# Patient Record
Sex: Male | Born: 1945 | Race: Black or African American | Hispanic: No | Marital: Single
Health system: Southern US, Community
[De-identification: ages and names within clinical notes are randomized; demographics above are authoritative.]

---

## 2013-01-02 ENCOUNTER — Inpatient Hospital Stay: Payer: Self-pay | Admitting: Internal Medicine

## 2013-01-02 LAB — COMPREHENSIVE METABOLIC PANEL
Albumin: 3.6 g/dL (ref 3.4–5.0)
Bilirubin,Total: 0.4 mg/dL (ref 0.2–1.0)
Calcium, Total: 8.3 mg/dL — ABNORMAL LOW (ref 8.5–10.1)
Chloride: 107 mmol/L (ref 98–107)
Co2: 25 mmol/L (ref 21–32)
Creatinine: 1.19 mg/dL (ref 0.60–1.30)
EGFR (African American): >60
EGFR (Non-African Amer.): >60
Glucose: 131 mg/dL — ABNORMAL HIGH (ref 65–99)
Osmolality: 286 (ref 275–301)
Potassium: 3.6 mmol/L (ref 3.5–5.1)
SGOT(AST): 64 U/L — ABNORMAL HIGH (ref 15–37)
SGPT (ALT): 56 U/L (ref 12–78)
Total Protein: 7.3 g/dL (ref 6.4–8.2)

## 2013-01-02 LAB — DIFFERENTIAL
Basophil: 1 %
Comment - H1-Com1: NORMAL
Eosinophil: 2 %
Lymphocytes: 10 %
Monocytes: 8 %
Segmented Neutrophils: 77 %
Variant Lymphocyte - H1-Rlymph: 2 %

## 2013-01-02 LAB — CBC
HGB: 13.4 g/dL (ref 13.0–18.0)
MCV: 98 fL (ref 80–100)
Platelet: 219 10*3/uL (ref 150–440)
RBC: 4.11 10*6/uL — ABNORMAL LOW (ref 4.40–5.90)
WBC: 14.2 10*3/uL — ABNORMAL HIGH (ref 3.8–10.6)

## 2013-01-02 LAB — PRO B NATRIURETIC PEPTIDE: B-Type Natriuretic Peptide: 2117 pg/mL — ABNORMAL HIGH (ref 0–125)

## 2013-01-02 LAB — CK TOTAL AND CKMB (NOT AT ARMC): CK, Total: 114 U/L (ref 35–232)

## 2013-01-02 LAB — TROPONIN I: Troponin-I: 0.04 ng/mL

## 2013-12-31 ENCOUNTER — Inpatient Hospital Stay: Payer: Self-pay | Admitting: Internal Medicine

## 2013-12-31 LAB — COMPREHENSIVE METABOLIC PANEL
ALBUMIN: 3.5 g/dL (ref 3.4–5.0)
ALT: 44 U/L (ref 12–78)
Alkaline Phosphatase: 64 U/L
Anion Gap: 2 — ABNORMAL LOW (ref 7–16)
BUN: 25 mg/dL — ABNORMAL HIGH (ref 7–18)
Bilirubin,Total: 1.3 mg/dL — ABNORMAL HIGH (ref 0.2–1.0)
CALCIUM: 8.2 mg/dL — AB (ref 8.5–10.1)
Chloride: 108 mmol/L — ABNORMAL HIGH (ref 98–107)
Co2: 27 mmol/L (ref 21–32)
Creatinine: 1.57 mg/dL — ABNORMAL HIGH (ref 0.60–1.30)
EGFR (African American): 53 — ABNORMAL LOW
EGFR (Non-African Amer.): 46 — ABNORMAL LOW
Glucose: 80 mg/dL (ref 65–99)
Osmolality: 277 (ref 275–301)
Potassium: 4.5 mmol/L (ref 3.5–5.1)
SGOT(AST): 50 U/L — ABNORMAL HIGH (ref 15–37)
Sodium: 137 mmol/L (ref 136–145)
Total Protein: 6.9 g/dL (ref 6.4–8.2)

## 2013-12-31 LAB — CBC
HCT: 37.9 % — AB (ref 40.0–52.0)
HGB: 12.6 g/dL — AB (ref 13.0–18.0)
MCH: 32.3 pg (ref 26.0–34.0)
MCHC: 33.2 g/dL (ref 32.0–36.0)
MCV: 98 fL (ref 80–100)
Platelet: 240 10*3/uL (ref 150–440)
RBC: 3.88 10*6/uL — ABNORMAL LOW (ref 4.40–5.90)
RDW: 15.5 % — AB (ref 11.5–14.5)
WBC: 7.3 10*3/uL (ref 3.8–10.6)

## 2013-12-31 LAB — TROPONIN I
TROPONIN-I: 0.02 ng/mL
Troponin-I: <0.02 ng/mL

## 2013-12-31 LAB — CK TOTAL AND CKMB (NOT AT ARMC)
CK, TOTAL: 188 U/L
CK, TOTAL: 161 U/L
CK, Total: 168 U/L
CK-MB: 4.5 ng/mL — ABNORMAL HIGH (ref 0.5–3.6)
CK-MB: 3.8 ng/mL — ABNORMAL HIGH (ref 0.5–3.6)
CK-MB: 3.8 ng/mL — AB (ref 0.5–3.6)

## 2013-12-31 LAB — PRO B NATRIURETIC PEPTIDE: B-Type Natriuretic Peptide: 9756 pg/mL — ABNORMAL HIGH (ref 0–125)

## 2014-01-01 LAB — CBC WITH DIFFERENTIAL/PLATELET
BASOS PCT: 0.9 %
Basophil #: 0.1 10*3/uL (ref 0.0–0.1)
Eosinophil #: 0.2 10*3/uL (ref 0.0–0.7)
Eosinophil %: 3.2 %
HCT: 38.2 % — ABNORMAL LOW (ref 40.0–52.0)
HGB: 12.3 g/dL — ABNORMAL LOW (ref 13.0–18.0)
LYMPHS ABS: 1.3 10*3/uL (ref 1.0–3.6)
Lymphocyte %: 21.9 %
MCH: 31.6 pg (ref 26.0–34.0)
MCHC: 32.3 g/dL (ref 32.0–36.0)
MCV: 98 fL (ref 80–100)
Monocyte #: 0.5 x10 3/mm (ref 0.2–1.0)
Monocyte %: 7.8 %
NEUTROS ABS: 3.9 10*3/uL (ref 1.4–6.5)
Neutrophil %: 66.2 %
PLATELETS: 238 10*3/uL (ref 150–440)
RBC: 3.91 10*6/uL — AB (ref 4.40–5.90)
RDW: 15.4 % — ABNORMAL HIGH (ref 11.5–14.5)
WBC: 5.9 10*3/uL (ref 3.8–10.6)

## 2014-01-01 LAB — BASIC METABOLIC PANEL
Anion Gap: 7 (ref 7–16)
BUN: 29 mg/dL — AB (ref 7–18)
CREATININE: 1.57 mg/dL — AB (ref 0.60–1.30)
Calcium, Total: 8.2 mg/dL — ABNORMAL LOW (ref 8.5–10.1)
Chloride: 106 mmol/L (ref 98–107)
Co2: 25 mmol/L (ref 21–32)
EGFR (African American): 53 — ABNORMAL LOW
EGFR (Non-African Amer.): 46 — ABNORMAL LOW
Glucose: 95 mg/dL (ref 65–99)
Osmolality: 281 (ref 275–301)
POTASSIUM: 4.1 mmol/L (ref 3.5–5.1)
Sodium: 138 mmol/L (ref 136–145)

## 2014-01-02 LAB — BASIC METABOLIC PANEL
Anion Gap: 5 — ABNORMAL LOW (ref 7–16)
BUN: 34 mg/dL — ABNORMAL HIGH (ref 7–18)
CALCIUM: 8.4 mg/dL — AB (ref 8.5–10.1)
CREATININE: 1.88 mg/dL — AB (ref 0.60–1.30)
Chloride: 104 mmol/L (ref 98–107)
Co2: 29 mmol/L (ref 21–32)
EGFR (African American): 42 — ABNORMAL LOW
EGFR (Non-African Amer.): 37 — ABNORMAL LOW
Glucose: 101 mg/dL — ABNORMAL HIGH (ref 65–99)
Osmolality: 283 (ref 275–301)
POTASSIUM: 4 mmol/L (ref 3.5–5.1)
SODIUM: 138 mmol/L (ref 136–145)

## 2014-05-24 ENCOUNTER — Emergency Department: Payer: Self-pay | Admitting: Emergency Medicine

## 2014-05-24 LAB — COMPREHENSIVE METABOLIC PANEL
ALT: 93 U/L — AB
ANION GAP: 11 (ref 7–16)
Albumin: 3 g/dL — ABNORMAL LOW (ref 3.4–5.0)
Alkaline Phosphatase: 76 U/L
BUN: 32 mg/dL — AB (ref 7–18)
Bilirubin,Total: 2.2 mg/dL — ABNORMAL HIGH (ref 0.2–1.0)
CHLORIDE: 104 mmol/L (ref 98–107)
Calcium, Total: 8.4 mg/dL — ABNORMAL LOW (ref 8.5–10.1)
Co2: 21 mmol/L (ref 21–32)
Creatinine: 1.72 mg/dL — ABNORMAL HIGH (ref 0.60–1.30)
GFR CALC AF AMER: 47 — AB
GFR CALC NON AF AMER: 41 — AB
GLUCOSE: 103 mg/dL — AB (ref 65–99)
OSMOLALITY: 279 (ref 275–301)
POTASSIUM: 4.5 mmol/L (ref 3.5–5.1)
SGOT(AST): 58 U/L — ABNORMAL HIGH (ref 15–37)
Sodium: 136 mmol/L (ref 136–145)
Total Protein: 6.8 g/dL (ref 6.4–8.2)

## 2014-05-24 LAB — CBC
HCT: 40.5 % (ref 40.0–52.0)
HGB: 12.7 g/dL — ABNORMAL LOW (ref 13.0–18.0)
MCH: 31.4 pg (ref 26.0–34.0)
MCHC: 31.3 g/dL — ABNORMAL LOW (ref 32.0–36.0)
MCV: 100 fL (ref 80–100)
PLATELETS: 250 10*3/uL (ref 150–440)
RBC: 4.04 10*6/uL — ABNORMAL LOW (ref 4.40–5.90)
RDW: 16.8 % — ABNORMAL HIGH (ref 11.5–14.5)
WBC: 9.2 10*3/uL (ref 3.8–10.6)

## 2014-05-24 LAB — SALICYLATE LEVEL

## 2014-05-24 LAB — ETHANOL
Ethanol %: <0.003 % (ref 0.000–0.080)
Ethanol: <3 mg/dL

## 2014-05-24 LAB — ACETAMINOPHEN LEVEL: Acetaminophen: <2 ug/mL

## 2014-05-25 LAB — DRUG SCREEN, URINE

## 2014-05-25 LAB — URINALYSIS, COMPLETE
BILIRUBIN, UR: NEGATIVE
BLOOD: NEGATIVE
GLUCOSE, UR: NEGATIVE mg/dL (ref 0–75)
Hyaline Cast: 72
Ketone: NEGATIVE
Leukocyte Esterase: NEGATIVE
NITRITE: NEGATIVE
PH: 5 (ref 4.5–8.0)
Protein: 30
Specific Gravity: 1.017 (ref 1.003–1.030)

## 2014-05-27 ENCOUNTER — Ambulatory Visit: Payer: Self-pay | Admitting: Oncology

## 2014-05-29 ENCOUNTER — Inpatient Hospital Stay: Payer: Self-pay | Admitting: Internal Medicine

## 2014-05-29 LAB — CBC
HCT: 38.7 % — ABNORMAL LOW (ref 40.0–52.0)
HGB: 12.1 g/dL — AB (ref 13.0–18.0)
MCH: 31.4 pg (ref 26.0–34.0)
MCHC: 31.3 g/dL — AB (ref 32.0–36.0)
MCV: 100 fL (ref 80–100)
PLATELETS: 227 10*3/uL (ref 150–440)
RBC: 3.86 10*6/uL — ABNORMAL LOW (ref 4.40–5.90)
RDW: 16.7 % — ABNORMAL HIGH (ref 11.5–14.5)
WBC: 8.1 10*3/uL (ref 3.8–10.6)

## 2014-05-29 LAB — TROPONIN I: TROPONIN-I: 0.09 ng/mL — AB

## 2014-05-29 LAB — BASIC METABOLIC PANEL
ANION GAP: 6 — AB (ref 7–16)
BUN: 30 mg/dL — AB (ref 7–18)
CALCIUM: 8 mg/dL — AB (ref 8.5–10.1)
CHLORIDE: 101 mmol/L (ref 98–107)
CO2: 28 mmol/L (ref 21–32)
Creatinine: 1.76 mg/dL — ABNORMAL HIGH (ref 0.60–1.30)
EGFR (Non-African Amer.): 40 — ABNORMAL LOW
GFR CALC AF AMER: 46 — AB
GLUCOSE: 96 mg/dL (ref 65–99)
OSMOLALITY: 276 (ref 275–301)
Potassium: 5.3 mmol/L — ABNORMAL HIGH (ref 3.5–5.1)
SODIUM: 135 mmol/L — AB (ref 136–145)

## 2014-05-29 LAB — PRO B NATRIURETIC PEPTIDE: B-TYPE NATIURETIC PEPTID: 7744 pg/mL — AB (ref 0–125)

## 2014-05-29 LAB — PROTIME-INR
INR: 1.3
PROTHROMBIN TIME: 16.2 s — AB (ref 11.5–14.7)

## 2014-05-29 LAB — APTT: ACTIVATED PTT: 30.4 s (ref 23.6–35.9)

## 2014-05-30 LAB — BASIC METABOLIC PANEL
Anion Gap: 9 (ref 7–16)
BUN: 32 mg/dL — ABNORMAL HIGH (ref 7–18)
CALCIUM: 8.6 mg/dL (ref 8.5–10.1)
Chloride: 101 mmol/L (ref 98–107)
Co2: 25 mmol/L (ref 21–32)
Creatinine: 1.81 mg/dL — ABNORMAL HIGH (ref 0.60–1.30)
EGFR (African American): 44 — ABNORMAL LOW
EGFR (Non-African Amer.): 38 — ABNORMAL LOW
Glucose: 105 mg/dL — ABNORMAL HIGH (ref 65–99)
Osmolality: 277 (ref 275–301)
Potassium: 4.9 mmol/L (ref 3.5–5.1)
Sodium: 135 mmol/L — ABNORMAL LOW (ref 136–145)

## 2014-05-30 LAB — CK-MB
CK-MB: 4.8 ng/mL — ABNORMAL HIGH (ref 0.5–3.6)
CK-MB: 5.2 ng/mL — AB (ref 0.5–3.6)
CK-MB: 5.7 ng/mL — ABNORMAL HIGH (ref 0.5–3.6)

## 2014-05-30 LAB — LIPID PANEL
Cholesterol: 104 mg/dL (ref 0–200)
HDL Cholesterol: 18 mg/dL — ABNORMAL LOW (ref 40–60)
Ldl Cholesterol, Calc: 72 mg/dL (ref 0–100)
Triglycerides: 68 mg/dL (ref 0–200)
VLDL Cholesterol, Calc: 14 mg/dL (ref 5–40)

## 2014-05-30 LAB — TROPONIN I
Troponin-I: 0.07 ng/mL — ABNORMAL HIGH
Troponin-I: 0.07 ng/mL — ABNORMAL HIGH

## 2014-05-30 LAB — HEPARIN LEVEL (UNFRACTIONATED): ANTI-XA(UNFRACTIONATED): 0.51 [IU]/mL (ref 0.30–0.70)

## 2014-05-31 LAB — BASIC METABOLIC PANEL
Anion Gap: 9 (ref 7–16)
BUN: 30 mg/dL — ABNORMAL HIGH (ref 7–18)
CO2: 25 mmol/L (ref 21–32)
Calcium, Total: 8.2 mg/dL — ABNORMAL LOW (ref 8.5–10.1)
Chloride: 102 mmol/L (ref 98–107)
Creatinine: 1.66 mg/dL — ABNORMAL HIGH (ref 0.60–1.30)
EGFR (African American): 49 — ABNORMAL LOW
EGFR (Non-African Amer.): 43 — ABNORMAL LOW
Glucose: 89 mg/dL (ref 65–99)
OSMOLALITY: 278 (ref 275–301)
POTASSIUM: 4.5 mmol/L (ref 3.5–5.1)
Sodium: 136 mmol/L (ref 136–145)

## 2014-06-01 LAB — BASIC METABOLIC PANEL
Anion Gap: 8 (ref 7–16)
BUN: 24 mg/dL — ABNORMAL HIGH (ref 7–18)
CHLORIDE: 99 mmol/L (ref 98–107)
CREATININE: 1.56 mg/dL — AB (ref 0.60–1.30)
Calcium, Total: 8 mg/dL — ABNORMAL LOW (ref 8.5–10.1)
Co2: 27 mmol/L (ref 21–32)
EGFR (African American): 53 — ABNORMAL LOW
EGFR (Non-African Amer.): 46 — ABNORMAL LOW
GLUCOSE: 97 mg/dL (ref 65–99)
Osmolality: 272 (ref 275–301)
Potassium: 4.6 mmol/L (ref 3.5–5.1)
Sodium: 134 mmol/L — ABNORMAL LOW (ref 136–145)

## 2014-06-18 LAB — COMPREHENSIVE METABOLIC PANEL
ALK PHOS: 128 U/L — AB
ALT: 35 U/L
AST: 41 U/L — AB (ref 15–37)
Albumin: 2.8 g/dL — ABNORMAL LOW (ref 3.4–5.0)
Anion Gap: 9 (ref 7–16)
BILIRUBIN TOTAL: 0.9 mg/dL (ref 0.2–1.0)
BUN: 25 mg/dL — ABNORMAL HIGH (ref 7–18)
CREATININE: 1.41 mg/dL — AB (ref 0.60–1.30)
Calcium, Total: 8.1 mg/dL — ABNORMAL LOW (ref 8.5–10.1)
Chloride: 104 mmol/L (ref 98–107)
Co2: 23 mmol/L (ref 21–32)
EGFR (African American): >60
GFR CALC NON AF AMER: 52 — AB
GLUCOSE: 101 mg/dL — AB (ref 65–99)
Osmolality: 276 (ref 275–301)
Potassium: 4.5 mmol/L (ref 3.5–5.1)
Sodium: 136 mmol/L (ref 136–145)
TOTAL PROTEIN: 6.8 g/dL (ref 6.4–8.2)

## 2014-06-18 LAB — CK TOTAL AND CKMB (NOT AT ARMC)
CK, Total: 226 U/L
CK, Total: 197 U/L
CK-MB: 5.1 ng/mL — ABNORMAL HIGH (ref 0.5–3.6)
CK-MB: 5.1 ng/mL — ABNORMAL HIGH (ref 0.5–3.6)

## 2014-06-18 LAB — CBC
HCT: 39.3 % — ABNORMAL LOW (ref 40.0–52.0)
HGB: 12.1 g/dL — ABNORMAL LOW (ref 13.0–18.0)
MCH: 29.8 pg (ref 26.0–34.0)
MCHC: 30.8 g/dL — AB (ref 32.0–36.0)
MCV: 97 fL (ref 80–100)
Platelet: 254 10*3/uL (ref 150–440)
RBC: 4.05 10*6/uL — ABNORMAL LOW (ref 4.40–5.90)
RDW: 15.7 % — ABNORMAL HIGH (ref 11.5–14.5)
WBC: 7.2 10*3/uL (ref 3.8–10.6)

## 2014-06-18 LAB — AMMONIA: Ammonia, Plasma: 20 mcmol/L (ref 11–32)

## 2014-06-18 LAB — DIGOXIN LEVEL: DIGOXIN: 0.4 ng/mL

## 2014-06-18 LAB — HEMOGLOBIN: HGB: 13.2 g/dL (ref 13.0–18.0)

## 2014-06-18 LAB — PROTIME-INR
INR: 1.5
PROTHROMBIN TIME: 17.8 s — AB (ref 11.5–14.7)

## 2014-06-18 LAB — APTT: ACTIVATED PTT: 28.6 s (ref 23.6–35.9)

## 2014-06-18 LAB — TROPONIN I
Troponin-I: <0.02 ng/mL
Troponin-I: <0.02 ng/mL

## 2014-06-18 LAB — PRO B NATRIURETIC PEPTIDE: B-TYPE NATIURETIC PEPTID: 18954 pg/mL — AB (ref 0–125)

## 2014-06-19 LAB — TROPONIN I: Troponin-I: <0.02 ng/mL

## 2014-06-19 LAB — COMPREHENSIVE METABOLIC PANEL
ALT: 35 U/L
ANION GAP: 12 (ref 7–16)
AST: 39 U/L — AB (ref 15–37)
Albumin: 2.8 g/dL — ABNORMAL LOW (ref 3.4–5.0)
Alkaline Phosphatase: 124 U/L — ABNORMAL HIGH
BILIRUBIN TOTAL: 1.5 mg/dL — AB (ref 0.2–1.0)
BUN: 27 mg/dL — ABNORMAL HIGH (ref 7–18)
CHLORIDE: 103 mmol/L (ref 98–107)
Calcium, Total: 8.5 mg/dL (ref 8.5–10.1)
Co2: 21 mmol/L (ref 21–32)
Creatinine: 1.58 mg/dL — ABNORMAL HIGH (ref 0.60–1.30)
EGFR (African American): 52 — ABNORMAL LOW
EGFR (Non-African Amer.): 45 — ABNORMAL LOW
GLUCOSE: 115 mg/dL — AB (ref 65–99)
Osmolality: 278 (ref 275–301)
Potassium: 4.6 mmol/L (ref 3.5–5.1)
Sodium: 136 mmol/L (ref 136–145)
Total Protein: 7.1 g/dL (ref 6.4–8.2)

## 2014-06-19 LAB — CBC WITH DIFFERENTIAL/PLATELET
Basophil #: 0 10*3/uL (ref 0.0–0.1)
Basophil %: 0.4 %
Eosinophil #: 0 10*3/uL (ref 0.0–0.7)
Eosinophil %: 0 %
HCT: 38.8 % — ABNORMAL LOW (ref 40.0–52.0)
HGB: 12.1 g/dL — ABNORMAL LOW (ref 13.0–18.0)
Lymphocyte #: 0.5 10*3/uL — ABNORMAL LOW (ref 1.0–3.6)
Lymphocyte %: 7 %
MCH: 30.5 pg (ref 26.0–34.0)
MCHC: 31.2 g/dL — ABNORMAL LOW (ref 32.0–36.0)
MCV: 98 fL (ref 80–100)
MONOS PCT: 10.9 %
Monocyte #: 0.8 x10 3/mm (ref 0.2–1.0)
NEUTROS ABS: 5.8 10*3/uL (ref 1.4–6.5)
NEUTROS PCT: 81.7 %
Platelet: 266 10*3/uL (ref 150–440)
RBC: 3.96 10*6/uL — ABNORMAL LOW (ref 4.40–5.90)
RDW: 16 % — ABNORMAL HIGH (ref 11.5–14.5)
WBC: 7.1 10*3/uL (ref 3.8–10.6)

## 2014-06-19 LAB — CK TOTAL AND CKMB (NOT AT ARMC)
CK, Total: 165 U/L
CK-MB: 5 ng/mL — ABNORMAL HIGH (ref 0.5–3.6)

## 2014-06-19 LAB — HEMOGLOBIN: HGB: 12.3 g/dL — ABNORMAL LOW (ref 13.0–18.0)

## 2014-06-20 ENCOUNTER — Inpatient Hospital Stay: Payer: Self-pay | Admitting: Internal Medicine

## 2014-06-20 LAB — CBC WITH DIFFERENTIAL/PLATELET
BASOS ABS: 0 10*3/uL (ref 0.0–0.1)
Basophil %: 0.2 %
EOS ABS: 0 10*3/uL (ref 0.0–0.7)
EOS PCT: 0.1 %
HCT: 40 % (ref 40.0–52.0)
HGB: 12.7 g/dL — ABNORMAL LOW (ref 13.0–18.0)
Lymphocyte #: 0.7 10*3/uL — ABNORMAL LOW (ref 1.0–3.6)
Lymphocyte %: 7.6 %
MCH: 30.8 pg (ref 26.0–34.0)
MCHC: 31.7 g/dL — AB (ref 32.0–36.0)
MCV: 97 fL (ref 80–100)
MONOS PCT: 11.3 %
Monocyte #: 1 x10 3/mm (ref 0.2–1.0)
NEUTROS PCT: 80.8 %
Neutrophil #: 7.2 10*3/uL — ABNORMAL HIGH (ref 1.4–6.5)
Platelet: 267 10*3/uL (ref 150–440)
RBC: 4.11 10*6/uL — ABNORMAL LOW (ref 4.40–5.90)
RDW: 16.1 % — ABNORMAL HIGH (ref 11.5–14.5)
WBC: 8.9 10*3/uL (ref 3.8–10.6)

## 2014-06-20 LAB — BASIC METABOLIC PANEL
Anion Gap: 11 (ref 7–16)
BUN: 35 mg/dL — ABNORMAL HIGH (ref 7–18)
Calcium, Total: 8.5 mg/dL (ref 8.5–10.1)
Chloride: 98 mmol/L (ref 98–107)
Co2: 25 mmol/L (ref 21–32)
Creatinine: 2.41 mg/dL — ABNORMAL HIGH (ref 0.60–1.30)
EGFR (Non-African Amer.): 27 — ABNORMAL LOW
GFR CALC AF AMER: 31 — AB
GLUCOSE: 121 mg/dL — AB (ref 65–99)
OSMOLALITY: 277 (ref 275–301)
POTASSIUM: 5 mmol/L (ref 3.5–5.1)
Sodium: 134 mmol/L — ABNORMAL LOW (ref 136–145)

## 2014-06-21 LAB — DIGOXIN LEVEL: DIGOXIN: 0.99 ng/mL

## 2014-06-21 LAB — HEMOGLOBIN: HGB: 12.8 g/dL — AB (ref 13.0–18.0)

## 2014-06-21 LAB — BASIC METABOLIC PANEL
Anion Gap: 10 (ref 7–16)
BUN: 47 mg/dL — AB (ref 7–18)
CO2: 23 mmol/L (ref 21–32)
Calcium, Total: 8.5 mg/dL (ref 8.5–10.1)
Chloride: 100 mmol/L (ref 98–107)
Creatinine: 2.55 mg/dL — ABNORMAL HIGH (ref 0.60–1.30)
EGFR (Non-African Amer.): 25 — ABNORMAL LOW
GFR CALC AF AMER: 29 — AB
GLUCOSE: 103 mg/dL — AB (ref 65–99)
OSMOLALITY: 279 (ref 275–301)
POTASSIUM: 5 mmol/L (ref 3.5–5.1)
Sodium: 133 mmol/L — ABNORMAL LOW (ref 136–145)

## 2014-06-22 LAB — BASIC METABOLIC PANEL
Anion Gap: 12 (ref 7–16)
BUN: 44 mg/dL — AB (ref 7–18)
CALCIUM: 8.7 mg/dL (ref 8.5–10.1)
CHLORIDE: 99 mmol/L (ref 98–107)
CO2: 21 mmol/L (ref 21–32)
CREATININE: 2.15 mg/dL — AB (ref 0.60–1.30)
EGFR (Non-African Amer.): 31 — ABNORMAL LOW
GFR CALC AF AMER: 36 — AB
GLUCOSE: 111 mg/dL — AB (ref 65–99)
Osmolality: 276 (ref 275–301)
POTASSIUM: 4.4 mmol/L (ref 3.5–5.1)
Sodium: 132 mmol/L — ABNORMAL LOW (ref 136–145)

## 2014-06-23 LAB — BASIC METABOLIC PANEL
Anion Gap: 10 (ref 7–16)
BUN: 40 mg/dL — ABNORMAL HIGH (ref 7–18)
CHLORIDE: 100 mmol/L (ref 98–107)
Calcium, Total: 9.1 mg/dL (ref 8.5–10.1)
Co2: 23 mmol/L (ref 21–32)
Creatinine: 1.84 mg/dL — ABNORMAL HIGH (ref 0.60–1.30)
EGFR (African American): 44 — ABNORMAL LOW
EGFR (Non-African Amer.): 38 — ABNORMAL LOW
Glucose: 113 mg/dL — ABNORMAL HIGH (ref 65–99)
Osmolality: 277 (ref 275–301)
Potassium: 4.6 mmol/L (ref 3.5–5.1)
Sodium: 133 mmol/L — ABNORMAL LOW (ref 136–145)

## 2014-06-23 LAB — HEMOGLOBIN: HGB: 12.7 g/dL — ABNORMAL LOW (ref 13.0–18.0)

## 2014-06-23 LAB — PATHOLOGY REPORT

## 2014-06-24 LAB — BASIC METABOLIC PANEL
ANION GAP: 9 (ref 7–16)
BUN: 39 mg/dL — AB (ref 7–18)
CALCIUM: 8.9 mg/dL (ref 8.5–10.1)
CHLORIDE: 98 mmol/L (ref 98–107)
CREATININE: 1.82 mg/dL — AB (ref 0.60–1.30)
Co2: 24 mmol/L (ref 21–32)
EGFR (African American): 44 — ABNORMAL LOW
EGFR (Non-African Amer.): 38 — ABNORMAL LOW
Glucose: 112 mg/dL — ABNORMAL HIGH (ref 65–99)
OSMOLALITY: 273 (ref 275–301)
Potassium: 5.1 mmol/L (ref 3.5–5.1)
Sodium: 131 mmol/L — ABNORMAL LOW (ref 136–145)

## 2014-06-25 LAB — BASIC METABOLIC PANEL
ANION GAP: 10 (ref 7–16)
BUN: 43 mg/dL — ABNORMAL HIGH (ref 7–18)
CO2: 25 mmol/L (ref 21–32)
CREATININE: 1.69 mg/dL — AB (ref 0.60–1.30)
Calcium, Total: 8.9 mg/dL (ref 8.5–10.1)
Chloride: 100 mmol/L (ref 98–107)
EGFR (African American): 48 — ABNORMAL LOW
EGFR (Non-African Amer.): 42 — ABNORMAL LOW
Glucose: 113 mg/dL — ABNORMAL HIGH (ref 65–99)
Osmolality: 282 (ref 275–301)
POTASSIUM: 5.3 mmol/L — AB (ref 3.5–5.1)
SODIUM: 135 mmol/L — AB (ref 136–145)

## 2014-06-25 LAB — POTASSIUM: Potassium: 5.1 mmol/L (ref 3.5–5.1)

## 2014-06-25 LAB — EXPECTORATED SPUTUM ASSESSMENT W REFEX TO RESP CULTURE

## 2014-06-25 LAB — HEMOGLOBIN: HGB: 12.7 g/dL — ABNORMAL LOW (ref 13.0–18.0)

## 2014-06-26 LAB — BASIC METABOLIC PANEL
Anion Gap: 10 (ref 7–16)
BUN: 45 mg/dL — AB (ref 7–18)
CO2: 28 mmol/L (ref 21–32)
Calcium, Total: 9.3 mg/dL (ref 8.5–10.1)
Chloride: 98 mmol/L (ref 98–107)
Creatinine: 1.73 mg/dL — ABNORMAL HIGH (ref 0.60–1.30)
EGFR (African American): 47 — ABNORMAL LOW
GFR CALC NON AF AMER: 41 — AB
GLUCOSE: 128 mg/dL — AB (ref 65–99)
OSMOLALITY: 285 (ref 275–301)
Potassium: 4.3 mmol/L (ref 3.5–5.1)
SODIUM: 136 mmol/L (ref 136–145)

## 2014-06-26 LAB — MAGNESIUM: Magnesium: 2.3 mg/dL

## 2014-06-27 ENCOUNTER — Ambulatory Visit: Payer: Self-pay | Admitting: Oncology

## 2014-06-28 LAB — BASIC METABOLIC PANEL
ANION GAP: 6 — AB (ref 7–16)
BUN: 50 mg/dL — ABNORMAL HIGH (ref 7–18)
CALCIUM: 9.6 mg/dL (ref 8.5–10.1)
CO2: 31 mmol/L (ref 21–32)
CREATININE: 1.71 mg/dL — AB (ref 0.60–1.30)
Chloride: 95 mmol/L — ABNORMAL LOW (ref 98–107)
EGFR (African American): 48 — ABNORMAL LOW
GFR CALC NON AF AMER: 41 — AB
GLUCOSE: 100 mg/dL — AB (ref 65–99)
OSMOLALITY: 278 (ref 275–301)
POTASSIUM: 4.6 mmol/L (ref 3.5–5.1)
Sodium: 132 mmol/L — ABNORMAL LOW (ref 136–145)

## 2014-06-28 LAB — MAGNESIUM: MAGNESIUM: 1.9 mg/dL

## 2014-07-27 ENCOUNTER — Ambulatory Visit: Payer: Self-pay | Admitting: Oncology

## 2014-07-27 DEATH — deceased

## 2015-01-21 IMAGING — CT CT HEAD WITHOUT CONTRAST
3 series · 16 of 30 positions shown, 17 images · non-contrast
Comparison: None.

CLINICAL DATA: Generalized weakness. Confusion and altered mental
status.

EXAM:
CT HEAD WITHOUT CONTRAST
TECHNIQUE: Contiguous axial images were obtained from the base of the skull
through the vertex without intravenous contrast.

[Series 2: head bone · axial · 0.45mm/px · z∈[-68,+72]mm · 8 of 85 slices shown]
[im 10/85  bone]
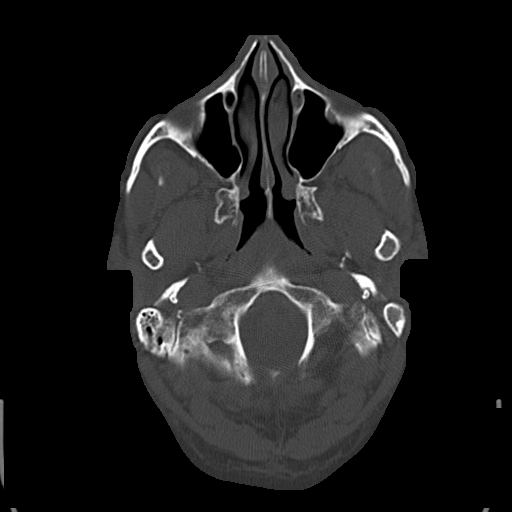
[im 20/85  bone]
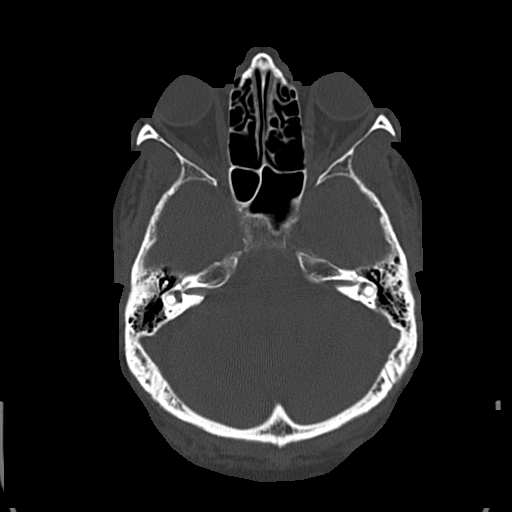
[im 30/85  bone]
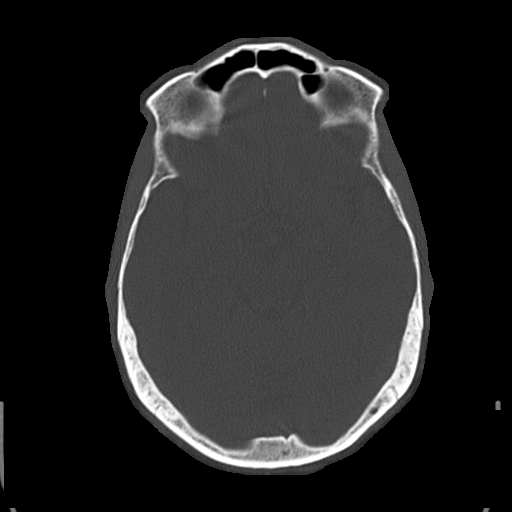
[im 40/85  bone]
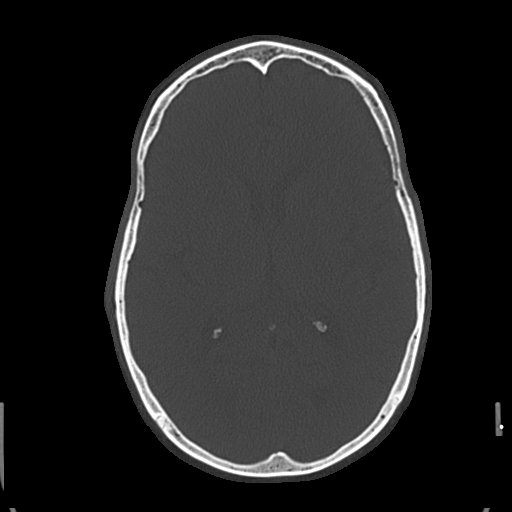
[im 50/85  bone]
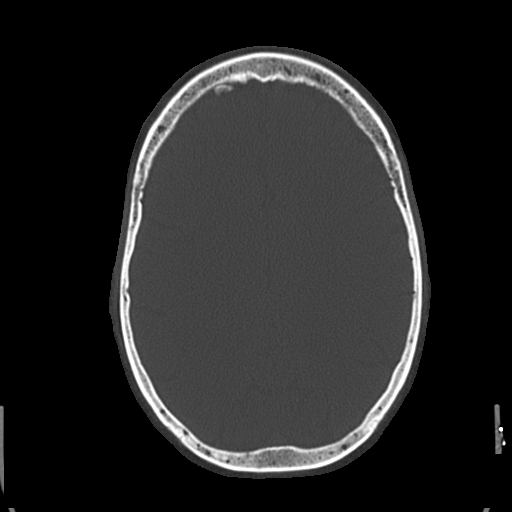
[im 60/85  bone]
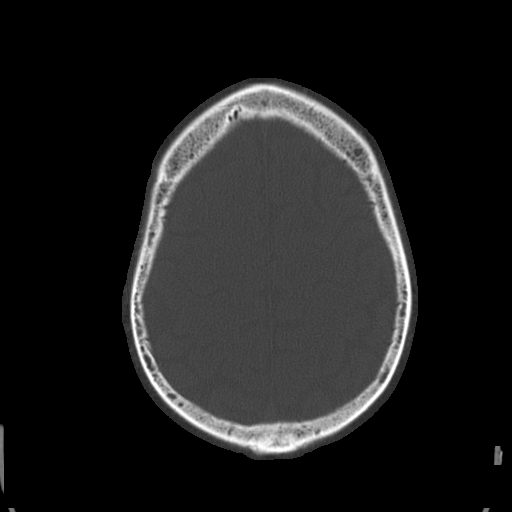
[im 70/85  bone]
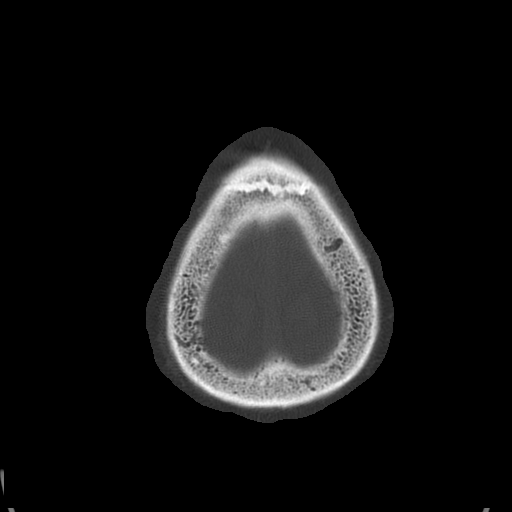
[im 80/85  bone]
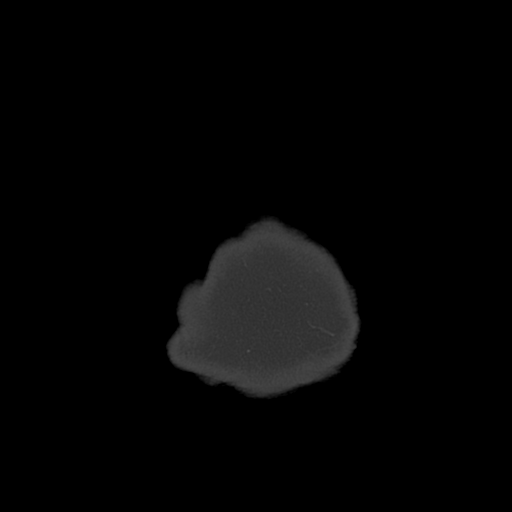

[Series 3: head wo · axial · 0.45mm/px · z∈[-46,+44]mm · 4 of 31 slices shown, 5 images]
[im 7/31  brain]
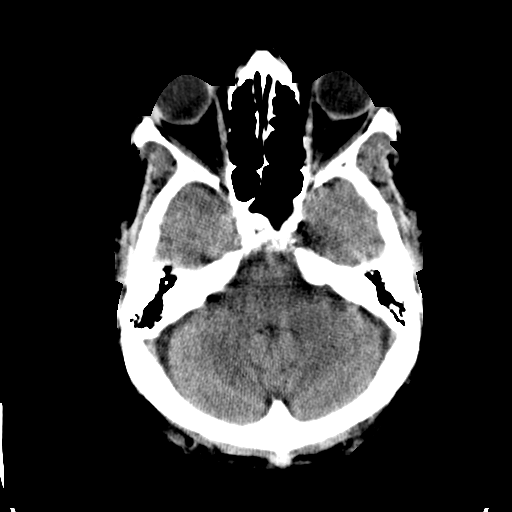
[im 7/31  bone]
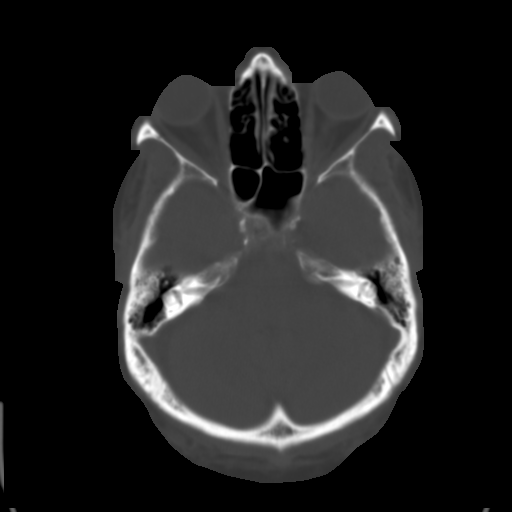
[im 13/31  brain]
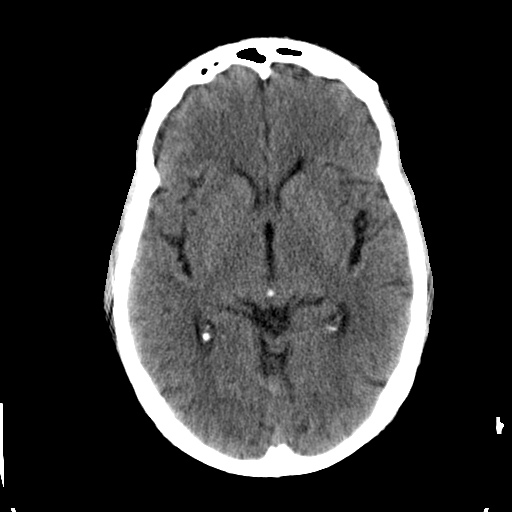
[im 19/31  brain]
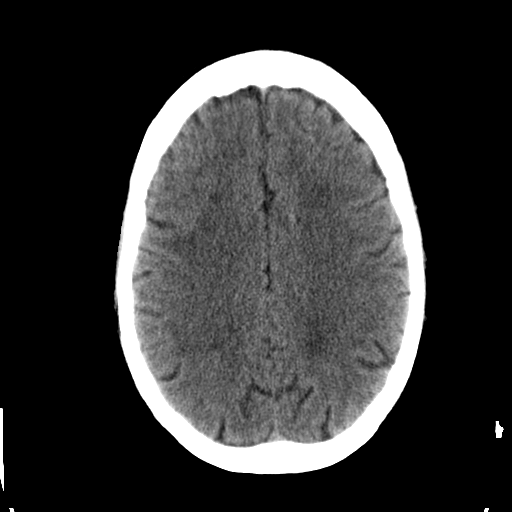
[im 25/31  brain]
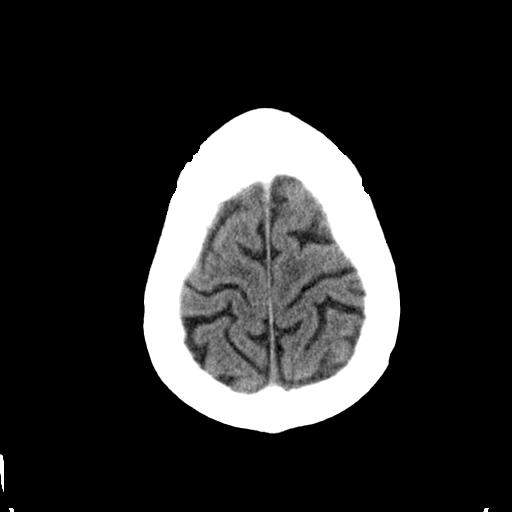

[Series 4: head wo recons · axial · 0.45mm/px · z∈[+3,+83]mm · 4 of 29 slices shown]
[im 6/29  brain]
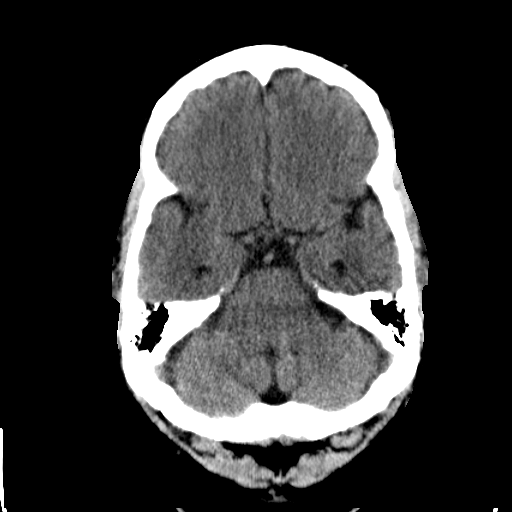
[im 12/29  brain]
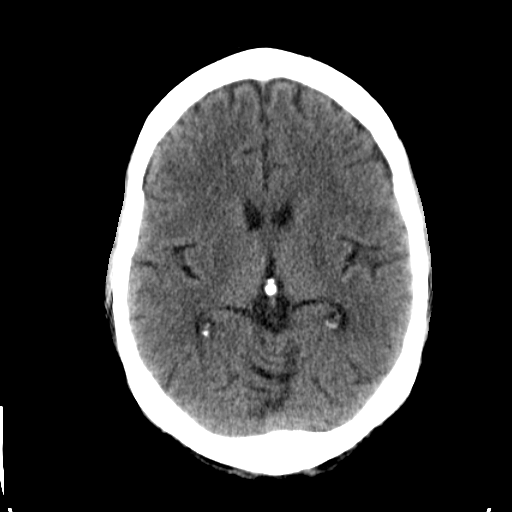
[im 17/29  brain]
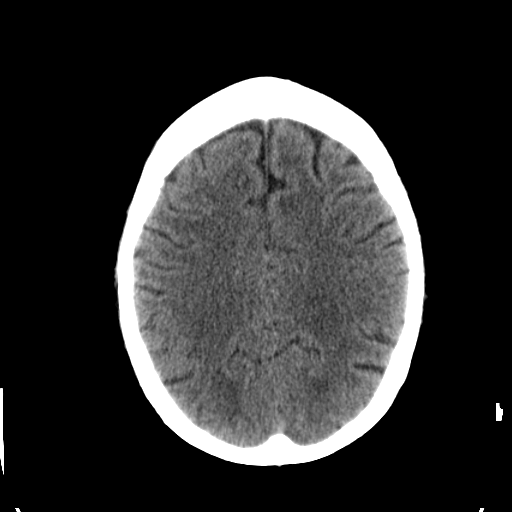
[im 23/29  brain]
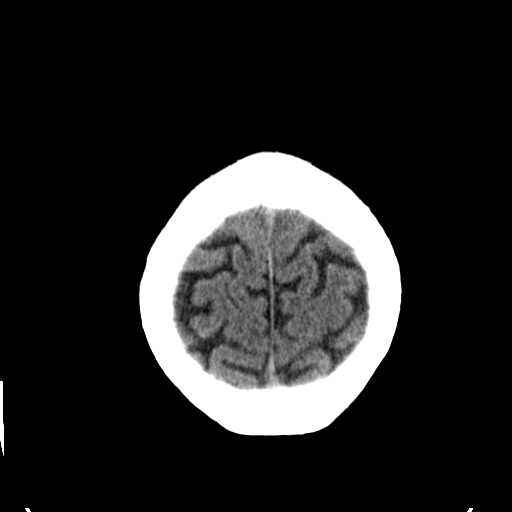

[16 of 30 positions shown; findings below may reference images not displayed]

FINDINGS: Mild cerebral atrophy. Low-attenuation changes in the deep white
matter consistent with small vessel ischemia. No ventricular
dilatation. No mass effect or midline shift. No abnormal extra-axial
fluid collections. Gray-white matter junctions are distinct. Basal
cisterns are not effaced. No evidence of acute intracranial
hemorrhage. No depressed skull fractures. Visualized paranasal
sinuses and mastoid air cells are not opacified.
IMPRESSION: No acute intracranial abnormalities.

## 2015-02-16 NOTE — Discharge Summary (Signed)
PATIENT NAME:  Jake Hill, Jaire MR#:  [id number] DATE OF BIRTH:  21-Sep-1946  DATE OF ADMISSION:  01/02/2013 DATE OF DISCHARGE:  01/03/2013  [facility]  DISCHARGE DIAGNOSES: 1. Systemic inflammatory response syndrome. 2. Pneumonia.  3. Chronic obstructive pulmonary disease exacerbation. 4. Leukocytosis.  5. Hypertension.  6. Congestive heart failure.   CONDITION: Stable.   CODE STATUS:  FULL CODE.     HOME MEDICATIONS: [facility]   NEW MEDICATIONS: Levaquin 75 mg p.o. daily for 5 days, prednisone 20 mg p.o. daily for 2 days and 10 mg p.o. daily for 2 days, Albuterol CFC Free 90 mcg inhalation 2 puffs 4 times a day p.r.n. for shortness of breath.   DIET: Low sodium diet.   ACTIVITY: As tolerated.   FOLLOW-UP CARE: With PCP within 1 to 2 weeks.   REASON FOR ADMISSION: Shortness of breath.   HOSPITAL COURSE: The patient is a 69 year old African American male with a history of congestive heart failure of unkown  type, hypertension and tobacco abuse who presented to the ED with shortness of breath. The patient's chest x-ray showed left lower lobe pneumonia. He received one dose of Levaquin. The patient's white count was 14,000. The patient was admitted for pneumonia and COPD exacerbation. For detailed history and physical examination, please refer to the admission note dictated by Dr. Enedina FinnerSona Patel. After admission, the patient has been treated with Levaquin and nebulizer p.r.n. with Solu-Medrol. The patient's shortness of breath has much improved.  He is clinically stable and will be discharged to home today.   I discussed the patient's discharge plan with the patient and the case manager.   TIME SPENT: About 35 minutes.   ____________________________ Shaune PollackQing Chen, MD qc:cb D: 01/03/2013 17:25:20 ET T: 01/04/2013 09:32:19 ET JOB#: [id number]  cc: Shaune PollackQing Chen, MD, <Dictator> Shaune PollackQING CHEN  MD ELECTRONICALLY SIGNED 01/04/2013 22:03

## 2015-02-16 NOTE — H&P (Signed)
PATIENT NAME:  Jake Hill, Jake Hill MR#:  [id number] DATE OF BIRTH:  [birth year]  DATE OF ADMISSION:  01/02/2013  PRIMARY CARE PHYSICIAN: Follows up at the [facility].   CHIEF COMPLAINT: Shortness of breath.   HISTORY OF PRESENT ILLNESS: The patient is a 69 year old African American male with a past medical history of continued tobacco use, congestive heart failure of unknown type, hypertension, presented to the Emergency Department with complaints of shortness of breath. The patient is an extremely poor historian. States that he has been experiencing shortness of breath for the last 7 years, however stated that last night could not breathe and came to the Emergency Department. The patient has some cough but no productive sputum. Denied having any fever. Having some generalized weakness. Denied having any lower extremity swelling, PND, orthopnea. Denies having any chest pain. At the time of the presentation, patient was diffusely wheezing. The patient was given breathing treatments. Chest x-ray was concerning about left lower lobe pneumonia. The patient received 1 dose of Levaquin in the Emergency Department. Has slightly elevated WBC count of 14,000 with neutrophils of 77%. Denied having any sick contacts.   PAST MEDICAL HISTORY:  1.  Hypertension.  2.  Hyperlipidemia.  3.  Congestive heart failure.  4.  Continued tobacco use.   PAST SURGICAL HISTORY: None.   ALLERGIES: No known drug allergies.   HOME MEDICATIONS:  1.  Simvastatin 40 mg 1 tablet once a day.  2.  Metoprolol 100 mg half-tablet daily.  3.  Lisinopril half-tablet daily.  4.  Imdur 1 tablet daily.  5.  Hydralazine 100 mg daily.  6.  Lasix 40 mg once a day.  7.  Etodolac 400 mg 1 tablet 2 times a day.  8.  Combivent 2 puffs 3 times a day. 9.  Aspirin 81 mg 1 tablet once a day.  10.  Amlodipine 10 mg daily.   SOCIAL HISTORY: Continues to smoke 4 to 5 cigarettes a day; in the past smoked up to 1 pack a day. Denies drinking alcohol or  using illicit drugs. Lives by himself.   FAMILY HISTORY: Strong family history of diabetes mellitus.    REVIEW OF SYSTEMS:  CONSTITUTIONAL: Generalized weakness.  EYES: No change in vision.  HEENT: No sore throat. No decreased hearing.  RESPIRATORY: Cough, shortness of breath.  CARDIOVASCULAR: No chest pain, palpitations.  GASTROINTESTINAL: Positive for decreased appetite. No constipation or diarrhea.  GENITOURINARY: Denies having any dysuria or hematuria.  ENDOCRINE: No polyuria, polydipsia, increased cold or hot intolerance.  SKIN: No rash or lesions.  HEMATOLOGIC: No increased bruising or bleeding.  NEUROLOGIC: Denies any weakness in any part of the body.   PHYSICAL EXAMINATION:  GENERAL:  This is a well-built, well-nourished, age-appropriate male lying down in the bed, not in distress.  VITAL SIGNS: Temperature 97.5, pulse 90, blood pressure 119/86, respiratory rate  of 20, oxygen saturation is 99% on 2 liters of oxygen. At the time of the presentation, oxygen saturations were 89%.  HEENT: Head normocephalic, atraumatic. There is no scleral icterus. Conjunctivae normal. Pupils equal and reactive to light. Mucous membranes moist.  NECK: Supple. No lymphadenopathy. No JVD. No carotid bruits.  CHEST: Has no focal tenderness. Lungs have bilateral prolonged expiratory phase and bilateral wheezing.  HEART: S1, S2 regular. No murmurs are heard.  ABDOMEN: Bowel sounds present. Soft, nontender, nondistended.  EXTREMITIES: No pedal edema. Pulses 2+.  NEUROLOGIC:  The patient is alert, oriented to place, person and time. Cranial nerves II-XII intact. No motor  and sensory deficits.   CBC: WBC of 14.2, hemoglobin 13.4, platelet count of 219. CMP is completely within normal limits. BNP is 2100.   Cardiac enzymes are within normal limits.   EKG: 12-lead; normal sinus rhythm with no ST or T wave abnormalities.   ASSESSMENT AND PLAN: The patient is a 69 year old male with continued tobacco use,  comes to the Emergency Department with complaints of shortness of breath.  1.  Pneumonia: Concern about left lower lobe pneumonia. Will continue with Levaquin. Has mild elevation of the WBC count.  2.  Chronic obstructive pulmonary disease exacerbation: Continue the breathing treatments, Solu-Medrol, and antibiotics.  3.  Hypertension: Currently well controlled. Continue with home medications.  4.  Congestive heart failure: Patient seems to be euvolemic at this time. Continue with Lasix.  5.  Keep the patient on deep vein thrombosis prophylaxis with Lovenox.  6. Tobacco use: Counseled with the patient for 3 minutes regarding complications of smoking. The patient expressed understanding and stated that trying to quit.    ____________________________ Susa GriffinsPadmaja Vasireddy, MD pv:dm D: 01/02/2013 07:13:00 ET T: 01/02/2013 07:31:13 ET JOB#: [id number]  cc: Susa GriffinsPadmaja Vasireddy, MD, <Dictator> Susa GriffinsPADMAJA VASIREDDY MD ELECTRONICALLY SIGNED 01/04/2013 7:07

## 2015-02-17 NOTE — H&P (Signed)
PATIENT NAME:  EWARD, RUTIGLIANO MR#:  [id number] DATE OF BIRTH:  1946/03/18  DATE OF ADMISSION:  05/29/2014  REFERRING PHYSICIAN: Dr. Mayford Knife.   [facility]  CHIEF COMPLAINT: Chest pain.   HISTORY OF PRESENT ILLNESS: This is a 69 year old African American gentleman with history of hypertension, hyperlipidemia, congestive heart failure, ejection fraction documented at less than 20%, chronic obstructive pulmonary disease, who presented with chest pain. He is an extremely poor historian, however, does complain of chest pain, described as retrosternal location, nonradiating, acute onset, pressure in quality, 7 out of 10 in intensity. No worsening or relieving factors. Associated with nausea and vomiting. Chest pain currently resolved. He does, however, have elevated troponins was noted in the Emergency Department on initial work-up.  REVIEW OF SYSTEMS: Question the reliability of this information, given his baseline confusion. However,  CONSTITUTIONAL: Denies fevers, chills, weakness.  EYES: Denies blurred vision, double vision, eye pain.  EARS, NOSE, THROAT: Denies tinnitus, ear pain, hearing loss.  RESPIRATORY: Denies cough, wheeze, or shortness of breath.  CARDIOVASCULAR: Positive for chest pain as described above. Denies any palpitations. Positive for edema.  GASTROINTESTINAL: Denies nausea, vomiting, diarrhea, abdominal pain.  GENITOURINARY: Denies dysuria, hematuria.  ENDOCRINE: Denies nocturia or thyroid problems.  HEMATOLOGIC AND LYMPHATIC: Denies easy bruising, bleeding.  SKIN: Denies rashes or lesions.  MUSCULOSKELETAL: Denies pain in neck, back, shoulder, knees, hips or arthritic symptoms.  NEUROLOGIC: Denies paralysis, paresthesias.  PSYCHIATRIC: Denies anxiety or depressive symptoms.  Otherwise, full review of systems performed by me is negative.   PAST MEDICAL HISTORY: Hyperlipidemia, hypertension, congestive heart failure, systolic ejection fraction documented at  less than 20%, chronic obstructive pulmonary disease, non-O2 dependent.   SOCIAL HISTORY: Denies alcohol, tobacco, or drug use.   FAMILY HISTORY: Denies any known cardiovascular or pulmonary disorders.   ALLERGIES: No known drug allergies.   HOME MEDICATIONS: Include aspirin 81 mg p.o. daily, lisinopril 10 mg p.o. daily, Imdur 60 mg p.o. daily, atorvastatin 40 mg 1/2 tab p.o. at bedtime, metoprolol 100 mg p.o. half tablet daily, budesonide formoterol 160/4.5 mcg inhalation 2 puffs b.i.d., Combivent 103 mcg/18 mcg inhalation 2 puffs 3 times daily for shortness of breath, Lasix 20 mg 2 tabs p.o. daily, hydralazine 50 mg p.o. q.8 hours.   PHYSICAL EXAMINATION: VITAL SIGNS: Temperature 98.9, heart rate 73, respirations 20, blood pressure 91/70, saturating 99% on room air. Weight 97.5 kg, BMI 29.2.  GENERAL: Well-nourished, well-developed, African American gentleman, currently in no acute distress.  HEAD: Normocephalic, atraumatic.  EYES: Pupils equal, round, reactive to light. Extraocular muscles intact. No scleral icterus.  MOUTH: Moist mucosal membranes. Dentition intact. No abscess noted.  EARS, NOSE, AND THROAT: Clear, without exudates. No external lesions.  NECK: Supple. No thyromegaly. No nodules. No JVD.  PULMONARY: Clear to auscultation bilaterally without wheezes, rubs or rhonchi. No use of accessory muscles. Good respiratory effort.  CHEST: Nontender to palpation.  CARDIOVASCULAR: S1, S2. Regular rate and rhythm. No murmurs, rubs, or gallops. Has 3+ edema in bilateral lower extremities to thighs. Pedal pulses 2+ bilaterally.  GASTROINTESTINAL: Soft, nontender, nondistended. No masses. Positive bowel sounds. No hepatosplenomegaly.  MUSCULOSKELETAL: No swelling, clubbing. Positive for edema as described above. Range of motion full in all extremities.  NEUROLOGIC: Cranial nerves II through XII intact. No gross focal neurological deficits. Sensation intact. Reflexes intact.  SKIN: No  ulcerations, lesions, no rashes, or cyanosis. Skin warm, dry. Turgor intact.  PSYCHIATRIC: Mood and affect within normal limits. He is awake, oriented to self, however, not  location or date. He actually states that the date is 04/29/1999.  LABORATORY DATA: EKG performed with normal sinus rhythm, no ST-T wave abnormalities. Chest x-ray performed: Left lower lobe consolidation with generalized cardiac enlargement. Laboratory data: Sodium 135, potassium 5.3, chloride 101, bicarbonate 28, BUN 30, creatinine 1.76, BNP 7744, glucose 96. Troponin 0.09. WBC 8.1, hemoglobin 12.1, platelets 227.   ASSESSMENT AND PLAN: A 69 year old African gentleman with history of hypertension, hyperlipidemia, congestive heart failure, systolic ejection fraction less than 20%, presenting with chest pain.  1.  Non-ST-elevation myocardial infarction. Admit to telemetry. Initiate aspirin and statin therapy, heparin drip. Trend cardiac enzymes x3. Consult cardiology. Get a transthoracic echocardiogram.  2.  Hyperkalemia. Give insulin and dextrose now, give Kayexalate as well. Follow BNP. Recheck potassium level in the morning.  3.  Hypertension, relatively hypotensive at this time. We will hold his medications.  4.  Chronic obstructive pulmonary disease. Continue with Symbicort, supplemental oxygen to keep oxygen saturation greater than 92%.  5.  Deep venous thrombosis prophylaxis with heparin drip.   CODE STATUS: The patient is full code.   TIME SPENT: 45 minutes.   ____________________________ Cletis Athensavid K. Cornelious Diven, MD dkh:cg D: 05/29/2014 23:44:32 ET T: 05/30/2014 01:05:03 ET JOB#: [id number]  cc: Cletis Athensavid K. Arris Meyn, MD, <Dictator> Fabienne Nolasco Synetta ShadowK Jarielys Girardot MD ELECTRONICALLY SIGNED 05/31/2014 0:17

## 2015-02-17 NOTE — Consult Note (Signed)
Brief Consult Note: Diagnosis: spitting blood, dysphagia.   Patient was seen by consultant.   Comments: Mr. Jake Hill is a pleasant 69 y/o black male with cardiomegaly EF<20% [facility], CHF (BMP [phone]), COPD, HTN, hyperlipidemia, pneumonia & anemia of chronic disease.  He states he has been "spitting out bright red blood" in past 10 months.  He was trying to eat chicken gizzards yesterday & had dysphagia & felt like they got stuck upper esophagus.  He then started spitting blood.  Denies hemoptysis or hematemesis.  Hgb is stable.  ENT has been consulted.  Would like to pursue EGD for dysphagia if cardiology agrees.  Timing depending on ENT findings.  Pt has remote hx intermittent heavy ETOH but gives no known hx of cirrhosis.  I have discussed his care with Dr Hilton SinclairWeiting.  Plan: 1) May DC sandostatin & protonix drips 2) Continue Protonix 40mg  BID 3) Follow up after ENT evaluation 4) Dysphagia II diet Thanks for allowing us to participate in his care.  Please see full dictated note. #[id number].  Electronic Signatures: Joselyn ArrowJones, Makyla Bye L (NP)  (Signed 24-Aug-15 10:46)  Authored: Brief Consult Note   Last Updated: 24-Aug-15 10:46 by Joselyn ArrowJones, Tonna Palazzi L (NP)

## 2015-02-17 NOTE — Op Note (Signed)
PATIENT NAME:  Jake Hill, Jake Hill MR#:  [id number] DATE OF BIRTH:  [birth year]  DATE OF PROCEDURE:  06/19/2014   PREOPERATIVE DIAGNOSIS: Hemoptysis.   POSTOPERATIVE DIAGNOSES: Hematochezia versus hemoptysis.   OPERATIVE PROCEDURE: Flexible laryngoscopy.  Flexible nasal endoscopy.  SURGEON: Jake Hill, M.D.   ANESTHESIA: Topical.   DESCRIPTION OF PROCEDURE: The patient was given topical anesthesia by spraying the nose with 4% Xylocaine mixed with Afrin. The flexible scope was used to visualize the nose, nasopharynx, hypopharynx and larynx. The right side shows the airway to be a little bit narrow. He has no sign of polyps. There is no blood staining that I could see in the front of the nose at all. The left side also has no blood staining. The airway is more open, sliding all the way through the back of the nose. There was no blood staining here. No sign of blood staining in the nasopharynx. There were no lesions in the nasopharynx; it looks healthy and smooth. Hypopharynx shows a small omega-shaped epiglottis. The base of tongue is clear. No sign of blood staining there. There is a little bit of fresh blood that is on the right posterior arytenoid, extending down into the esophageal inlet. There is no sign of lesions anywhere in the hypopharynx or larynx that I can see. There are no lesions in the nasopharynx or in the nasal cavity.   The patient tolerated the procedure well. This was done at the bedside. There were no operative complications.     ____________________________ Jake CopaPaul H. Elam Ellis, Jake Hill phj:MT D: 06/19/2014 18:12:25 ET T: 06/20/2014 07:29:34 ET JOB#: [id number]  cc: Jake CopaPaul H. Adaline Trejos, Jake Hill, <Dictator> Jake CopaPAUL H Krisandra Bueno Jake Hill ELECTRONICALLY SIGNED 06/27/2014 7:55

## 2015-02-17 NOTE — H&P (Signed)
PATIENT NAME:  Jake Hill, Jake Hill MR#:  [id number] DATE OF BIRTH:  [birth year]  DATE OF ADMISSION:  12/31/2013  REFERRING PHYSICIAN: Sheryl L. Mindi JunkerGottlieb, MD  [facility]   REASON FOR ADMISSION: Acute respiratory failure requiring BiPAP.   HISTORY OF PRESENT ILLNESS: The patient is a 69 year old male with a history of hypertension and hyperlipidemia. He has a history of diastolic CHF. Has a remote history of tobacco abuse as well as a history of COPD. He presents to the Emergency Room with worsening shortness of breath and peripheral edema. In the Emergency Room, the patient was found to be in severe respiratory distress requiring BiPAP. He was hypoxic and tachypneic. BNP was elevated, and chest x-ray shows congestive heart failure. He is now admitted for further evaluation. He denies chest pain. No previous MI.   PAST MEDICAL HISTORY:  1. COPD. 2. Hyperlipidemia.  3. Benign hypertension.  4. History of diastolic congestive heart failure.  5. Remote history of alcohol abuse.   MEDICATIONS:  1. Lasix 40 mg p.o. daily.  2. Combivent 1 puff q.i.d.  3. Aspirin 81 mg p.o. daily.  4. Amlodipine 10 mg p.o. daily.  5. Albuterol 2 puffs q.4 hours p.r.n. shortness of breath.  6. Hydralazine 100 mg p.o. q.8 hours. 7. Imdur 60 mg p.o. daily.  8. Lisinopril 10 mg p.o. daily.  9. Lopressor 50 mg p.o. daily.  10. Zocor 40 mg p.o. at bedtime.   ALLERGIES: No known drug allergies.   SOCIAL HISTORY: The patient quit smoking and drinking 5 years ago. No history of drug use.   FAMILY HISTORY: Positive for coronary artery disease, diabetes, hypertension. Also positive for stroke. Negative for colon or prostate cancer.   REVIEW OF SYSTEMS:  CONSTITUTIONAL: No fever or change in weight.  EYES: No blurred or double vision. No glaucoma.  ENT: No tinnitus or hearing loss. No nasal discharge or bleeding. No difficulty swallowing.  RESPIRATORY: The patient has had cough, but denies wheezing or  hemoptysis. No painful respiration.  CARDIOVASCULAR: No chest pain. Some orthopnea. No palpitations or syncope.  GASTROINTESTINAL: No nausea, vomiting or diarrhea. No abdominal pain. No change in bowel habits.  GENITOURINARY: No dysuria or hematuria. No incontinence.  ENDOCRINE: No polyuria or polydipsia. No heat or cold intolerance.  HEMATOLOGIC: The patient denies anemia, easy bruising or bleeding.  LYMPHATIC: No swollen glands.  MUSCULOSKELETAL: The patient denies pain in his neck, back, shoulders, knees or hips. No gout.  NEUROLOGIC: No numbness or migraines. Denies stroke or seizures.  PSYCHIATRIC: The patient denies anxiety, insomnia or depression.   PHYSICAL EXAMINATION:  GENERAL: The patient is acutely ill, in moderate respiratory distress on BiPAP.  VITAL SIGNS: Remarkable for a blood pressure of 120/82, heart rate 92, respiratory rate of 32, temperature of 97. Saturation 95% on BiPAP.  HEENT: Normocephalic, atraumatic. Pupils equally round and reactive to light and accommodation. Extraocular movements are intact. Sclerae are nonicteric. Conjunctivae are clear. Oropharynx is clear.  NECK: Supple, with JVD noted to the angle of the jaw. No thyromegaly. No bruits.  LUNGS: Reveal rales approximately one-third of the way up bilaterally. No wheezes or rhonchi. No dullness. Respiratory effort is increased.  CARDIAC: Rapid rate with a regular rhythm. Normal S1 and S2. No significant rubs, murmurs or gallops. PMI is nondisplaced. Chest wall is nontender.  ABDOMEN: Soft, nontender, with normoactive bowel sounds. No organomegaly or masses were appreciated. No hernias or bruits were noted.  EXTREMITIES: Revealed 2 to 3+ edema bilaterally. Pulses were 2+  bilaterally.  SKIN: Warm and dry without rash or lesions.  NEUROLOGIC: Revealed cranial nerves II through XII grossly intact. Deep tendon reflexes were symmetric. Motor and sensory exam is nonfocal.  PSYCHIATRIC: Revealed a patient who was alert  and oriented to person, place and time. He was cooperative and used good judgment.   LABORATORY DATA: EKG revealed sinus rhythm with diffuse nonspecific ST-T wave changes noted. Lateral T wave inversion was present. Chest x-ray revealed cardiomegaly with congestive heart failure. His white count was 7.3 with a hemoglobin of 12.6. Troponin less than 0.02. BNP was 9756. Glucose 80 with a BUN of 25, creatinine 1.57 with a GFR of 53. Sodium was 137 with a potassium of 4.5.   ASSESSMENT:  1. Acute congestive heart failure, presumably diastolic.  2. Benign hypertension.  3. COPD exacerbation.  4. Renal insufficiency.  5. Anemia of chronic disease.  6. Anxiety.   PLAN: The patient will be admitted to telemetry as a FULL CODE. He will be started on IV Lasix. Will optimize his pulmonary regimen and maximize his blood pressure regimen. Will follow serial cardiac enzymes and obtain an echocardiogram. Will consult cardiology for further evaluation and treatment. Empiric IV antibiotics at this time given the patient's history of COPD and recent course of oral steroids and oral antibiotics. Followup chest x-ray and labs in the morning. Wean off BiPAP as tolerated. Further treatment and evaluation will depend upon the patient's progress.   TOTAL TIME SPENT ON THIS PATIENT: 50 minutes.   ____________________________ Duane Lope Judithann Sheen, MD jds:lb D: 12/31/2013 13:07:00 ET T: 12/31/2013 13:20:27 ET JOB#: [id number]  cc: Duane Lope. Judithann Sheen, MD, <Dictator> Selig Wampole Rodena Medin MD ELECTRONICALLY SIGNED 01/02/2014 15:37

## 2015-02-17 NOTE — H&P (Signed)
PATIENT NAME:  Jake Hill, Jake Hill MR#:  [id number] DATE OF BIRTH:  1945-11-22  DATE OF ADMISSION:  06/18/2014  PRIMARY CARE PHYSICIAN: Toya Smothers, MD  HISTORY OF PRESENT ILLNESS: The patient is a 69 year old African American male with past medical history significant for history of cardiomyopathy with ejection fraction of less than 20%, who presents to the hospital with complaints of recurrent chest pain. According to the patient, he has been having chest pains intermittently over the past 1 month. Pain is described as very sharp, 10/10 in intensity and like a knife, intermittent, would last a few seconds, it would come and go, come and go, increasing with deep breathing as well as drinking cold drinks or eating. He was admitted to the hospital on the 3rd, discharged on the 06/02/2014 for recurrent chest pains. At that time, he was noted to have mild elevation of troponin, was felt to be noncardiac. No cardiac catheterization was performed by cardiologist and no other recommendations were made. The patient was discharged from the hospital; however, now comes back with very similar kind of pains. He says that this pain which he is having today is very similar to prior pain. He admits to shortness of breath. He is also coughing up blood for the past 2 or 3 days, now dark red blood, no significant sputum production though. He also has been vomiting with bloody fluid. Because of that, hospitalist services were contacted for admission.   PAST MEDICAL HISTORY: Significant for history of admission from the 3rd to 06/02/2014 for chest pains with elevated troponin, which was felt to be noncardiac. History of hyperkalemia during the same admission. Hypertension with intermittent hypotension episodes, which was felt to be due to severe cardiomyopathy. Cardiomyopathy with ejection fraction less than 20%. 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 Past medical  history is also significant for hyperlipidemia, hypertension, and not using oxygen therapy.   SOCIAL HISTORY: No alcohol or tobacco drug abuse; however, admits using alcohol in the past.   FAMILY HISTORY: Denies any known cardiovascular disease or pulmonary disorders.   ALLERGIES: No known drug allergies.   MEDICATIONS: According to medical records, the patient was discharged on 06/02/2014 with medications which are Combivent 2 puffs 3 times daily, aspirin 81 mg p.o. daily, budesonide and Formoterol 2 puffs twice daily, Lasix 10 mg 2 tablets once daily, atorvastatin unknown dose half tablet once at bedtime, isosorbide mononitrate 30 mg p.o. daily, metoprolol succinate 25 mg p.o. daily, digoxin 125 mcg p.o. daily, Tylenol 350 mg every 4 hours as needed for pain.   REVIEW OF SYSTEMS:  GENERAL: Difficult to obtain as the patient is somewhat confused. Admits to having some chest pains, which are coming and going intermittently in his chest, also cough with hemoptysis and shortness of breath, lower extremity edema which is chronic, seems to be worsening over a period of time. Intermittent nausea, vomiting, as well as hematemesis over the past few days. Denies any fevers, chills, fatigue, weakness, weight loss or gain.  EYES: Denies any blurry vision, double vision, glaucoma or cataracts.  EARS, NOSE, THROAT: Denies any tinnitus, allergies, epistaxis, sinus pain, dentures, difficulty swallowing.  RESPIRATORY: Denies any wheezes, asthma, COPD.  CARDIOVASCULAR: Denies any orthopnea, arrhythmias, palpitations or syncope.  GASTROINTESTINAL: Admits to vomiting. Denies any diarrhea or abdominal pain, rectal bleeding, change in bowel habits.  GENITOURINARY: Denies dysuria, hematuria, frequency or incontinence.  ENDOCRINOLOGY: Denies any polydipsia, nocturia, thyroid problems, heat of cold  intolerance or thirst. HEMATOLOGIC: Denies anemia, easy bruising or bleeding, swollen glands.  SKIN: Denies any acne,  rash, lesions or change in moles.  MUSCULOSKELETAL: Denies arthritis, cramps, swelling.  NEUROLOGIC: Denies numbness, epilepsy or tremor.  PSYCHIATRIC: Denies anxiety, insomnia, depression.   PHYSICAL EXAMINATION:  VITAL SIGNS: On arrival to the hospital, temperature was 97.3, pulse 104, respiration was 18, blood pressure 128/95, saturation was 97% on room air.  GENERAL: This is a well-developed, well-nourished, obese [place] American male, somewhat somnolent lying on the stretcher.  HEENT: His pupils are equal, reactive to light. Extraocular muscles intact, no icterus or conjunctivitis. No pharyngeal erythema. Mucosa is moist. The patient is somnolent, however, is able to wake up just for a short period of time and then converse; however, very difficult to ensure that it is correct information with him. NECK: No masses. Supple, nontender. Thyroid is not enlarged. No adenopathy. No JVD or carotid bruits bilaterally. Full range of motion.  LUNGS: Clear to auscultation, though diminished breath sounds at bases. No rales, rhonchi or wheezing. No labored inspirations, increased effort, dullness to percussion, overt respiratory distress.  CARDIOVASCULAR: S1, S2 were appreciated. No murmurs, gallops or rubs were noted. PMI not lateralized. Chest is nontender to palpation. Diminished pedal pulses. Significant lower extremity edema was noted bilaterally without tenderness, but no cyanosis.  ABDOMEN: Protuberant, soft, nontender. Bowel sounds are present. No hepatosplenomegaly or masses were noted.  RECTAL: Deferred.  MUSCLE STRENGTH: Able to move all extremities. No cyanosis, degenerative joint disease or kyphosis. Gait was not tested.  SKIN: Did not reveal any rashes, lesions, erythema, nodularity, however, the patient did have significant induration in lower extremities. Skin was warm and dry to palpation.  LYMPHATIC: No adenopathy in the cervical region.  NEUROLOGIC: Cranial nerves grossly intact.  Sensory grossly intact. The patient is somewhat dysarthric and is somnolent, poorly cooperative. Memory is impaired. He is confused.   LABORATORY DATA: BMP showed a glucose of 101, BUN and creatinine were 25 and 1.41, otherwise BMP was unremarkable. The patient's beta-type natriuretic peptide was 18,952. The patient's albumin level of 2.8, alkaline phosphatase 128, AST was 41. Cardiac enzymes: Troponin was less than 0.02. Digoxin level is 0.4. White blood cell count is normal at 7.2, hemoglobin was 12.1, platelet count 254,000. Absolute neutrophil count is not checked. The patient's pro time is elevated at 17.8, INR was 1.5 and activated PTT was 28.6.   EKG revealed sinus tachycardia with first-degree AV block at 105 beats per minute, left axis deviation, possible left atrial enlargement, ST depressions in high lateral leads, inferior infarct with Q waves in III, aVF as well as II and anterior infarct with poor R wave progression in V3, as well as in V4, age undetermined. Chest x-ray revealed stable cardiomegaly and persistent consolidation in left lower lobe as well as a new area of consolidation in the right lower lobe.   ASSESSMENT AND PLAN:  1.  Chest pain. Suspect gastrointestinal cause, questionable esophageal. Start PPI, IV drip as well as simvastatin due to history of alcohol abuse in the past as well as now hematemesis. We will get gastroenterology consultation.  2.  Hematemesis. We will continue PPIs as well as simvastatin. We will check hemoglobin every 8 hours and transfuse as needed.  3.  We will hold aspirin therapy and we will give vitamin K for mild coagulopathy with INR of 1.5.  4.  Hemoptysis and questionable pneumonia on chest x-ray. We will get CT scan of the chest with no  contrast and we will initiate the patient on Levaquin. We will get also sputum cultures if he produces any. 5.  Coagulopathy. Vitamin K.  6.  Questionable pneumonia. As mentioned above, we will get sputum cultures  if possible, CT scan of the chest, and we will initiate the patient on antibiotic therapy. 7.  Renal insufficiency. We are not able to administer IV fluids at this time due to severe fluid retention overload.  8.  Lower extremity swelling. We will get Doppler ultrasound to rule out deep venous thrombosis.  9.  Altered mental status, confusion. We will get ammonia level to rule out hepatic encephalopathy.   TIME SPENT: Fifty minutes.    ____________________________ Jake Caperima Rayshawn Maney, MD rv:TT D: 06/18/2014 21:31:49 ET T: 06/18/2014 22:09:12 ET JOB#: [id number]  cc: Jake Caperima [place], MD, <Dictator> Michela Pitcheregan, Lance, MD Deveron Shamoon MD ELECTRONICALLY SIGNED 07/24/2014 21:39

## 2015-02-17 NOTE — Discharge Summary (Signed)
PATIENT NAME:  Jake Hill, Jake Hill MR#:  [id number] DATE OF BIRTH:  1945/11/15  DATE OF ADMISSION:  05/29/2014 DATE OF DISCHARGE:  06/02/2014  ADMITTING DIAGNOSES: Chest pain, shortness of breath.   DISCHARGE DIAGNOSES:  1. Chest pain, felt to be atypical by cardiology. No evidence of myocardial infarction. Cardiology did not feel that this was a myocardial infarction. The patient may have underlying coronary artery disease, but due to his renal failure, and other severe cardiomyopathy, cardiology did not feel that a catheterization would be beneficial in this patient.  2. Hyperkalemia.  3. Hypertension with intermittent hypotension noted during hospitalization due to his severe cardiomyopathy.  4. Severe cardiomyopathy with very low ejection fraction.  5. Chronic obstructive pulmonary disease.  6. Acute on chronic systolic congestive heart failure.  7. Right lower back mass of unclear cause, needs outpatient followup.   PERTINENT LABS AND EVALUATIONS: Admitting glucose 96, BUN 30, creatinine 1.76, sodium 135, potassium 5.3, chloride 101, CO2 is 28, calcium 8.0. Troponins 0.09, 0.07 and 0.07. Creatinine on August 6th 1.56. WBC 8.1, hemoglobin 12.1, platelet count is 227,000. Echocardiogram showed EF of less than 20%, severe decreased global left ventricular systolic function, severely increased left ventricular hypertrophy, internal cavity, moderate left ventricular hypertrophy.   HOSPITAL COURSE: Please refer to H and P done by the admitting physician. The patient is a 69 year old African-American male who [facility] presented to the hospital with complaint of pinprick sensation in the chest lasting for a second. The patient has been having this for a while. Apparently he has been [facility] in the [facility] and has refused. Came in with these symptoms. He also was having shortness of breath. He was thought to have acute congestive heart failure. Further  evaluation with echocardiogram revealed he had a systolic congestive heart failure. He was treated with Lasix with improvement in his symptoms. In terms of the chest pain, he was seen by cardiology and they felt that he would be a poor candidate for any type of intervention. They did not feel that his chest pain was cardiac related. The patient at this time is doing much better and will need outpatient follow-up.   Discharge instructions for congestive heart failure given.   DISCHARGE MEDICATIONS: Combivent 2 puffs t.i.d., aspirin 81 mg 1 tab p.o. daily, budesonide/formoterol 2 puffs b.i.d., Lasix 20, two tabs daily, atorvastatin half tab at bedtime, isosorbide mononitrate 30 q. daily, metoprolol succinate 25 p.o. q. daily, digoxin 125 mcg q. daily, Tylenol 650 q. 4 p.r.n. for pain.   DIET: Low-sodium, low-fat, low-cholesterol.   ACTIVITY: As tolerated.   FOLLOWUP: With primary M.D. in 1 to 2 weeks. Follow with Dr. Darrold JunkerParaschos in 2 to 4 weeks. When he follows up with his primary physician further evaluation is required for the right back mass as outpatient.   TIME SPENT: 35 minutes.     ____________________________ Lacie ScottsShreyang H. Allena KatzPatel, MD shp:jh D: 06/02/2014 14:20:17 ET T: 06/02/2014 23:29:06 ET JOB#: [id number]  cc: Rufus Cypert H. Allena KatzPatel, MD, <Dictator> Charise CarwinSHREYANG H Nerine Pulse MD ELECTRONICALLY SIGNED 06/07/2014 10:38

## 2015-02-17 NOTE — Consult Note (Signed)
Psychiatry: Patient seen. Folow up. No new complaints. Labs and studies show no specific new illness. Patient does not appear depressed or psychotic and is alert and oriented and feels confident to go home. Rec [facility] mild dementia prob vascular. Discussed with Dr Cyril LoosenKinner  Electronic Signatures: Toni Amendlapacs, Jackquline DenmarkJohn T (MD)  (Signed on 31-Jul-15 15:40)  Authored  Last Updated: 31-Jul-15 15:40 by Audery Amellapacs, Trayonna Bachmeier T (MD)

## 2015-02-17 NOTE — Consult Note (Signed)
PATIENT NAME:  Jake Hill, Jake Hill MR#:  [id number] DATE OF BIRTH:  November 06, 1945  DATE OF CONSULTATION:  06/19/2014  REFERRING PHYSICIAN:   CONSULTING PHYSICIAN:  Joselyn Arrow, NP  REQUESTING PHYSICIAN: Dr. Winona Legato.  PRIMARY CARE PHYSICIAN: Dr. Michela Pitcher  CONSULTING GASTROENTEROLOGIST: Dr. Midge Minium.  HISTORY OF PRESENT ILLNESS: Mr. Jake Hill is a 69 year old male with a history of cardiomyopathy with ejection fraction of less than 20%, congestive heart failure, COPD, hypertension, hyperlipidemia, and anemia of chronic disease, presents with "spitting up blood." Yesterday afternoon, he tells he was trying to eat some chicken gizzards. He felt like they became lodged in his upper mid esophagus. He stopped eating. He has been "spitting up blood" in small amounts since November 2014. After the episode with the chicken gizzards, he noticed blood was pooling in his mouth. He denies any postnasal drip, epistaxis or hemoptysis. He does have dysphagia with solid foods. He denies any problems with liquids. He denies any heartburn or indigestion. He believes he has lost about 17 pounds. He was started on octreotide drip, Protonix drip and Zofran and Levaquin.   His hemoglobin has remained stable at 12.1 since yesterday. His INR is 1.5. His BNP is 18,954. His ammonia is normal. His total bilirubin is 1.5, alkaline phosphatase 124, AST 39, albumin 2.8. Otherwise, normal LFTs. He denies any blood in his stools. Denies any melena. Denies any abdominal pain.   He did drink alcohol heavily, quit 10 years ago. He tells me he drank about 7 days per month about 5 beverages each day.   CT of the chest, abdomen and pelvis showed consolidation of the right lower lobe, right pleural effusion, diffuse cardiac enlargement, indeterminate lesions in the liver and kidneys, gallbladder contracted, a prominent celiac axis and retroperitoneal nodes, small to moderate pelvic ascites, filling defect in the posterior bladder base,  questionable transitional cell neoplasm, an enlarged prostate.   Chest x-ray showed cardiomegaly and persistent left lower lobe consolidation and new right lower lobe consolidation.   He denies any NSAIDs and he has never had a colonoscopy or EGD. Yesterday morning was his last meal; however, he has had liquids this morning.   PAST MEDICAL AND SURGICAL HISTORY: Cardiomyopathy, ejection fraction less than 20%, congestive heart failure, COPD, hyperlipidemia, hypertension, heavy alcohol use, anemia of chronic disease and anxiety.   MEDICATIONS PRIOR TO ADMISSION: Aspirin 81 mg daily, atorvastatin 40 mg 1/2 tablet daily, budesonide/formoterol 80 mcg/4.5 mcg inhalation 2 puffs b.i.d., Combivent CFC free 100 mcg 1 puff q.i.d., digoxin 125 mcg daily, furosemide 40 mg 1-1/2 daily, isosorbide 30 mg daily, metoprolol 25 mg extended release daily.   ALLERGIES: No known drug allergies.   FAMILY HISTORY: There is no known family history of depression or lower chronic GI problems.   SOCIAL HISTORY: He is divorced. He lives alone. He has 3 grown healthy children. He is retired from the [employer]. He has moderately heavy alcohol history, quitting 10 years ago. Denies any illicit drug use. He quit smoking in 1980.  REVIEW OF SYSTEMS: See history of present illness; otherwise, negative 10 point review of systems.   PHYSICAL EXAMINATION:  VITAL SIGNS: Temperature 98.1, pulse 98, respirations 20, blood pressure 111/79, oxygen saturation 92% on room air. Weight 221 pounds, height 71.9 inches, BMI 30.2.  GENERAL: He is an elderly black male, who is alert, oriented, pleasant, cooperative, in no acute distress.  HEENT: Sclerae clear, anicteric. Conjunctivae pink. Oropharynx pink and moist. He is edentulous.  NECK: Supple without mass or  thyromegaly.  CHEST: Heart regular rate and rhythm. Normal S1, S2. No murmurs, clicks, rubs, or gallops.  LUNGS: Clear to auscultation bilaterally.  ABDOMEN: He has a small,  easily reducible umbilical hernia which is nontender. Abdomen is soft, nontender, nondistended without palpable mass or hepatosplenomegaly. No rebound tenderness or guarding.  EXTREMITIES: He has 2+ pitting edema pretibially bilaterally. MUSCULOSKELETAL:  Good movement and strength bilaterally.  NEUROLOGIC: Grossly intact.  PSYCHIATRIC: Alert, cooperative, normal mood and affect.   LABORATORY STUDIES: Glucose 115, BUN 27, creatinine 1.58; otherwise, normal basic metabolic panel. Ammonia 20. Troponins negative x 3. CK-MB 5, CK total 165. Hemoglobin 12.1, hematocrit 38.8, white blood cells 7.1, platelets 266,000. INR 1.5, prothrombin 17.8.   IMPRESSION: Mr. Jake Hill is a pleasant 69 year old black male with cardiomegaly with  an ejection of less than 24%, [facility], congestive heart failure (BNP [phone]), chronic obstructive pulmonary disease, hypertension, hyperlipidemia, pneumonia and anemia of chronic disease. He states he has been "spitting out bright red blood" in the past 10 months. He was trying to eat chicken gizzards yesterday and had dysphagia and felt they got stuck in his upper esophagus. He then started spitting blood. Denies hemoptysis or hematemesis. Hemoglobin is stable. ENT has been consulted.   Would like to pursue EGD for dysphagia if cardiology agrees he is stable. Timing depending on ENT findings. He has remote history of intermittent heavy alcohol, but he gives no known history of cirrhosis. I have discussed his care with Dr. Renae GlossWieting. I will be discussing his care with Dr. Midge Miniumarren Wohl.   PLAN:  1. May discontinue Sandostatin, Protonix drips.  2. Continue Protonix 40 mg b.i.d.  3. Follow up after ENT evaluation.  4. Dysphagia 2 diet.  Thank you for allowing us to participate in his care.    ____________________________ Joselyn ArrowKandice L. Sammantha Mehlhaff, NP klj:JT D: 06/19/2014 10:45:00 ET T: 06/19/2014 11:56:24 ET JOB#: [id number]  cc: Joselyn ArrowKandice L. Bohdi Leeds, NP,  <Dictator> Joselyn ArrowKANDICE L Traven Davids FNP ELECTRONICALLY SIGNED 06/26/2014 14:33

## 2015-02-17 NOTE — Consult Note (Signed)
History of Present Illness:  Reason for Consult bladder and kidney mass, highly suspicious for malignancy.   HPI   Patient is a 69 year old male with multiple medical problems who presented to the hospital with complaints of recurrent chest pain. He also is complaining of shortness of breath as well as hemoptysis. Subsequent workup including CT scan revealed a bladder as well as suspicious kidney masses. Patient is lethargic, but arousable. There appears to be underlying dementia, therefore review of systems is difficult. He does not report any fevers. He does not report any weight loss. He denies any pain. He denies any urinary problems. Patient offers no further specific complaints.  PFSH:  Additional Past Medical and Surgical History cardiomyopathy with EF less than 20%, COPD, CHF, hyperlipidemia, hypertension, possible dementia.  Social history: No report of tobacco or alcohol use.  Family history: Negative and noncontributory.   Review of Systems:  Performance Status (ECOG) 2   Review of Systems   As per HPI. Otherwise, 10 point system review was negative.   NURSING NOTES: **Vital Signs.:   28-Aug-15 16:15   Vital Signs Type: Q 4hr   Temperature Temperature (F): 97.1   Celsius: 36.1   Pulse Pulse: 91   Respirations Respirations: 20   Systolic BP Systolic BP: 027   Diastolic BP (mmHg) Diastolic BP (mmHg): 76   Mean BP: 88   Pulse Ox % Pulse Ox %: 90   Pulse Ox Activity Level: At rest   Oxygen Delivery: Room Air/ 21 %   Physical Exam:  Physical Exam General: Well-developed, well-nourished, no acute distress. Eyes: Pink conjunctiva, anicteric sclera. HEENT: Normocephalic, moist mucous membranes, clear oropharnyx. Lungs: Clear to auscultation bilaterally. Heart: Regular rate and rhythm. No rubs, murmurs, or gallops. Abdomen: Soft, nontender, nondistended. No organomegaly noted, normoactive bowel sounds. Musculoskeletal: No edema, cyanosis, or  clubbing. Neuro: lethargic, but arousable.. Skin: No rashes or petechiae noted. Psych: Normal affect. Lymphatics: No cervical, calvicular, axillary or inguinal LAD.    No Known Allergies:     isosorbide mononitrate 30 mg oral tablet, extended release: 1 tab(s) orally once a day, Status: Active, Quantity: 30, Refills: None   digoxin 125 mcg (0.125 mg) oral tablet: 1 tab(s) orally once a day, Status: Active, Quantity: 30, Refills: None   metoprolol succinate 25 mg oral tablet, extended release: 1 tab(s) orally once a day, Status: Active, Quantity: 30, Refills: None   atorvastatin 40 mg oral tablet: 0.5 tab (63m) orally once a day (at bedtime), Status: Active, Quantity: 0, Refills: None   budesonide-formoterol 80 mcg-4.5 mcg/inh inhalation aerosol: 2 puff(s) inhaled 2 times a day, Status: Active, Quantity: 0, Refills: None   Combivent CFC free 100 mcg-20 mcg/inh inhalation aerosol: 1 puff(s) inhaled 4 times a day, As Needed, Status: Active, Quantity: 0, Refills: None   furosemide 40 mg oral tablet: 1.5 tabs (648m orally once a day, Status: Active, Quantity: 0, Refills: None   aspirin 81 mg oral delayed release tablet: 1 tab(s) orally once a day, Status: Active, Quantity: 30, Refills: None  Laboratory Results: Routine Chem:  28-Aug-15 05:43   Glucose, Serum  113  BUN  40  Creatinine (comp)  1.84  Sodium, Serum  133  Potassium, Serum 4.6  Chloride, Serum 100  CO2, Serum 23  Calcium (Total), Serum 9.1  Anion Gap 10  Osmolality (calc) 277  eGFR (African American)  44  eGFR (Non-African American)  38 (eGFR values <6059min/1.73 m2 may be an indication of chronic kidney disease (CKD). Calculated eGFR is  useful in patients with stable renal function. The eGFR calculation will not be reliable in acutely ill patients when serum creatinine is changing rapidly. It is not useful in  patients on dialysis. The eGFR calculation may not be applicable to patients at the low and high  extremes of body sizes, pregnant women, and vegetarians.)  Routine Hem:  28-Aug-15 05:43   Hemoglobin (CBC)  12.7 (Result(s) reported on 23 Jun 2014 at [facility].)   Radiology Results: CT:    23-Aug-15 21:31, CT Chest Abdomen and Pelvis WO  CT Chest Abdomen and Pelvis WO   REASON FOR EXAM:    (1) chest pain, coughing up blood; (2) vomiting blood  COMMENTS:       PROCEDURE: CT  - CT CHEST ABDOMEN AND PELVIS WO  - Jun 18 2014  9:31PM     CLINICAL DATA:  Acute onset of chest pain 1 hr ago. Left-sided chest  pain with nausea, vomiting, shortness of breath, coughing up blood.    EXAM:  CT CHEST, ABDOMEN AND PELVIS WITHOUT CONTRAST    TECHNIQUE:  Multidetector CT imaging of the chest, abdomen and pelvis was  performed following the standard protocol without IV contrast.  COMPARISON:  None.    FINDINGS:  CT CHEST FINDINGS    Evaluation of vascular structures and mediastinal structures is  limited without IV contrast material.    There appears to be nodular enlargement of the thyroid gland.  Consider elective ultrasound for further evaluation. There is  diffuse cardiac enlargement. Coronary artery calcification. No  evidence of pericardial effusion. Normal caliber thoracic aorta. No  gross mediastinal or hilar lymphadenopathy. Esophagus is  decompressed. There is asmall right pleural effusion. Focal  consolidation in the right middle lung and in the right lower lung  suggest infiltrative process such as pneumonia. Hemorrhage could  also have this appearance. Emphysematous changes and scattered  fibrosis demonstrated in both lungs. No discrete pulmonary nodule.  No pneumothorax. Large lipoma inferior to the right scapula.    CT ABDOMEN AND PELVIS FINDINGS    Evaluation of solid organs and vascular structures is limited  without IV contrast material. There is small to moderate diffuse  abdominal and pelvic fluid likely ascites. Scattered sub cm  low-attenuation lesions in the  liver of indeterminate etiology. The  gallbladder appears contracted with thickened wall. This could  represent inflammatory process or reaction to ascites. Spleen size  is normal. No bile duct dilatation. Unenhanced appearance of the  pancreas is grossly unremarkable. Left adrenal gland appears  enlarged, probably hyperplastic. Kidneys demonstrate multiple  masses, some with low-attenuation, some with increased attenuation,  and somewhat isointense to renal parenchyma. Correlation with  ultrasound or contrast-enhanced CT study is recommended to exclude  renal cell carcinoma versus atypical cysts. No evidence of  hydronephrosis or stone. Calcification of abdominal aorta without  aneurysm. Normal caliber inferior vena cava. Indeterminate mildly  enlarged retroperitoneal and celiac axis lymph nodes. These may be  reactive or inflammatory. Stomach, small bowel, and colon are  decompressed. No free intra-abdominal air. Small umbilical hernia  containing fat.    Pelvis: Nodular filling defect in the posterior base of the bladder  appears to be separate from the prostate gland and probably  represents an intraluminal bladder lesions such as transitional cell  neoplasm. Mild diffuse bladder wall thickening. Prostate gland is  enlarged, measuring 4.4 x 4.2 cm. No significant lymphadenopathy.  Appendix is normal. Colon is decompressed. Diffuse soft tissue edema  in the subcutaneous fat.  The    Bones: Degenerative changes throughout the thoracic and lumbar  spine. Hemangioma in the mid thoracic vertebra. No destructive bone  lesions appreciated.     IMPRESSION:  Focal areas of consolidation in the right middle lung andright  lower lung with right pleural effusion. Changes likely pneumonia or  other infiltrative process. Hemorrhage could also have this  appearance. Diffuse cardiac enlargement.  Indeterminate lesions in the liver and kidneys. Gallbladder appears  contracted and thick-walled.  Prominent celiac axis and  retroperitoneal lymph nodes, probably reactive. Small to moderate  abdominal and pelvic ascites. Filling defect in the posterior  bladder base suspicious for transitional cell neoplasm. Prostate  enlargement.      Electronically Signed    By: Lucienne Capers M.D.    On: 06/18/2014 22:01         Verified By: Neale Burly, M.D.,   Assessment and Plan: Impression:   bladder and kidney mass, highly suspicious for malignancy. Plan:   1. Bladder and kidney mass: Highly suspicious for underlying malignancy. Patient will require biopsy prior to proceeding with any treatment. Given patient's multiple medical problem and possible underlying dementia, he likely is not a surgical candidate. Palliative care has been consulted. Appreciate input. Also, given patient's poor performance status and medical history, aggressive treatment may not be possible either. Once patient's power of attorney arrives from [place], can further discuss future treatment options. consult, will follow.  Electronic Signatures: Delight Hoh (MD)  (Signed 28-Aug-15 17:53)  Authored: HISTORY OF PRESENT ILLNESS, PFSH, ROS, NURSING NOTES, PE, ALLERGIES, HOME MEDICATIONS, LABS, OTHER RESULTS, ASSESSMENT AND PLAN   Last Updated: 28-Aug-15 17:53 by Delight Hoh (MD)

## 2015-02-17 NOTE — Consult Note (Signed)
PATIENT NAME:  Jake Hill, Jake Hill MR#:  [id number] DATE OF BIRTH:  1945/10/29  DATE OF CONSULTATION:  06/19/2014  REQUESTING PHYSICIAN:   Herschell Dimes. Renae Gloss, MD CONSULTING PHYSICIAN:  Cammy Copa, MD  REASON FOR CONSULTATION: Hemoptysis. Evaluate for possible nasal laryngeal source.   HISTORY OF PRESENT ILLNESS: The patient is a 69 year old African American male with a history of cardiomyopathy, congestive heart failure, COPD, hypertension, anemia of chronic disease. He has been spitting up blood over the last couple of days. He was trying to eat some chicken gizzards and felt like it became lodged some in the upper mid esophagus. He stopped eating. He started to spit this up and ended up blood was pooling in his mouth. He has not had any nosebleed at all. No blood dripping from his nose. He does not feel like there is anything coming down the back of his throat. He has had little bits of blood in his mouth periodically ever since November of last year. He feels like he is swallowing liquids okay, but he has lost some weight. He has been on acid inhibitors.   PAST MEDICAL HISTORY: Significant for cardiomyopathy, ejection fraction less than 20% congestive heart failure, COPD, hyperlipidemia, hypertension, heavy alcohol use, anemia of chronic disease, and anxiety.   DRUG ALLERGIES: None known.   CURRENT MEDICATIONS: As noted in the chart and reviewed.   FAMILY HISTORY: No history of depression or chronic GI problems.   SOCIAL HISTORY: He lives alone and is divorced. He has 3 grown children. He denies any drug use. He quit smoking in 1980. He used to drink alcohol very heavily, quitting 10 years ago.    PHYSICAL EXAMINATION:  GENERAL:  The patient is elderly. He is a little bit disoriented. He seems like he may have sleep apnea as he continues to fall asleep while I am evaluating him.  HEENT:  His ears look relatively clear. His nose is open anteriorly. Oropharynx shows normal tongue and no signs of  any bleeding inside the mouth. No lesions that I can see. NECK: Negative for any nodes or masses, although he is perspiring currently and says he is hot.    Flexible laryngoscopy is done and this is dictated in detail elsewhere. The nose is open and clear and no sign of blood staining anywhere in the nose. No staining in the back of the nose and nasopharynx. There are no lesions here. This is healthy. The hypopharynx shows no lesions in the base of tongue. The epiglottis is small and mega shape, but no sign of lesions here. The vocal cords are pearly white, move well. No lesions there. There is 1 small string of fresher blood that is running from the right arytenoid and down into the esophageal inlet. I do not see any redness in the subglottic area. No lesions noted anywhere in the hypopharynx or larynx.   IMPRESSION: The patient has had hemoptysis of unknown source. This may be coming from the esophagus and varices here. I do not see any varices at the tongue base nor the back of the nose. There is no sign of any blood staining in the nose at all. I do not think it is a nasal bleeding problem, but appears to be more of esophageal problem. I do not see evidence of blood coming up through the larynx or on the cords.   I think he is a great candidate for upper gastrointestinal endoscopy to look for possible bleeding in the esophagus.  ____________________________ Cammy CopaPaul H. Lonney Revak, MD phj:LT D: 06/19/2014 18:18:12 ET T: 06/19/2014 22:10:41 ET JOB#: [id number]  cc: Cammy CopaPaul H. Cebert Dettmann, MD, <Dictator> Cammy CopaPAUL H Blaize Epple MD ELECTRONICALLY SIGNED 06/27/2014 7:54

## 2015-02-17 NOTE — Consult Note (Signed)
   Comments   I spoke again with pt's daughter, Rojelio Brennerarika Siple. She has spoken with her brother and sister. They want to respect pt's wishes and agree with DNR status.  notes that there is a 2nd document in pt's chart for HCPOA dated 06/19/14. This document names pt's brother, Raynelle Highlandyrone Umland as Hornitos[place]CPOA. This would supercede pt's document in computer dated 05/29/14. I called to speak with him but no answer. Message left.   Electronic Signatures: Kaige Whistler, Harriett SineNancy (MD)  (Signed 28-Aug-15 15:29)  Authored: Palliative Care   Last Updated: 28-Aug-15 15:29 by Demarr Kluever, Harriett SineNancy (MD)

## 2015-02-17 NOTE — Consult Note (Signed)
Brief Consult Note: Diagnosis: Dementia.   Patient was seen by consultant.   Consult note dictated.   Orders entered.   Discussed with Attending MD.   Comments: PSychiatry: PAtient seen and chart reviewewd and note dictated. Patient with no past psych hx with recent increase confusion. No acute dangerousness to self or others. May have had worsening of vascular dementia. If no acute medical illness identified suggest discharge and consider APS coonsult.  Electronic Signatures: Audery Amellapacs, Mohammedali Bedoy T (MD)  (Signed 30-Jul-15 18:03)  Authored: Brief Consult Note   Last Updated: 30-Jul-15 18:03 by Audery Amellapacs, Mattheo Swindle T (MD)

## 2015-02-17 NOTE — Consult Note (Signed)
PATIENT NAME:  Jake Hill, Jake Hill MR#:  [id number] DATE OF BIRTH:  1946-10-10  DATE OF CONSULTATION:  05/25/2014  CONSULTING PHYSICIAN:  Jake Amel, MD  IDENTIFYING INFORMATION AND REASON FOR CONSULTATION: A 69 year old man brought voluntarily to the Emergency Room because of concern that he was more confused. Consultation for evaluation of mental status changes.   HISTORY OF PRESENT ILLNESS: Information obtained from the patient and secondarily from his girlfriend, and from review of his old records. The patient states that he was at home in his room today when the police came, knocking on his door and brought him to the hospital. He could not give me any clear indication than that about what was going on. Apparently, when he saw the nurse this morning, he was complaining that he was feeling confused and has been having more trouble with confusion recently. He does not talk about being depressed or having any suicidal ideation. Denies hallucinations. Denies he has had any change to his medicine. His girlfriend apparently called the law because of concern that he is getting more and more confused. She has told the staff that his current mental state is not normal for him. The only other objective comparison I have is when he came to the hospital for his heart failure some months ago, and at that time was described as being a very poor historian.   PAST PSYCHIATRIC HISTORY: As far as I can tell, there is no past psychiatric history. The patient denies ever being seen by a psychiatrist. Denies any psychiatric hospitalization or psychiatric medicine. There is no indication on any of the paperwork or old notes here, that I can see that he has had any history of psychiatric treatment.   PAST MEDICAL HISTORY: The patient has congestive heart failure, COPD, high blood pressure. He had recently been in the hospital for a worsening episode of congestive heart failure. He is followed at the [facility], [place].  It is not  known right now if he is still on the same medications, although he did have a bag of medicine with him.   SOCIAL HISTORY: Evidently, lives independently. It is hard to get a clear social history from him. He can tell me that he was last living in [place], [place], but exactly how he wound up in [place] remains unclear.   FAMILY HISTORY: Denies family history of mental illness.   SUBSTANCE ABUSE HISTORY: Denies alcohol or drug abuse now or problems with it in the past.   REVIEW OF SYSTEMS: The patient is feeling sick to his stomach. He started throwing up, or at least coughing up mild amounts of stomach contents while we were talking. He is feeling tired. He is feeling confused. Denies suicidal ideation. Denies any hallucinations or delusions.   CURRENT MEDICATIONS: According to the medicine reconciliation, he is taking aspirin 81 mg a day, lisinopril 10 mg a day, isosorbide 60 mg once a day atorvastatin 20 mg per day,  metoprolol 50 mg twice a day, budesonide formoterol 2 puffs 2 times a day, Combivent inhaler 2 puffs 3 times a day, Lasix 40 mg a day, hydralazine 50 mg every 8 hours.   ALLERGIES: No known drug allergies.   MENTAL STATUS EXAMINATION: Disheveled gentleman who looks his stated age or older. Passively cooperative with the interview. Eye contact initially good becomes bad later in the interview. Psychomotor activity sluggish. Speech is decreased in rate and amount and a little bit slurred. Affect is blunted. Mood stated as all right. Thoughts  appear to be slow and somewhat disorganized. Did not make any obviously bizarre or delusional statements. Denies hallucinations. Denies suicidal or homicidal ideation. He was unable to complete full cognitive testing because of being overcome by coughing and being sick to his stomach that he was not oriented completely to where he was.  [place].  He was disoriented as to the date. He was able to name objects and repeat a  phrase. I was not able to test his memory because, it was at that point that he gave up on the testing.   LABORATORY RESULTS: Urinalysis positive for protein. Does not appear to be infected. Head CT, no acute lesions but does look like he has chronic small vessel disease. Drug screen is all negative. Creatinine elevated at 1.72, which is a little higher than it was last time. AST elevated at 58, albumin low at 3. CBC: Low hemoglobin at 12.7.   ASSESSMENT: This is a 69 year old gentleman who apparently has had a subacute worsening of his mental state. He appears to be at least mildly demented right now. It is not clear how much of a change this is from his baseline, since he was described as a poor historian in the past. There is no indication here of acute dangerousness to self or others. The patient is not threatening, not suicidal and there is no clear evidence of his being unsafe at home. I recommend that he be evaluated medically to make sure there is no new medical problem that could be making him more delirious. If nothing can be found, I do not see any indication for acute psychiatric treatment. If there is concern about his safety, Adult Protective Services can be notified. No change, otherwise to treatment.  No  medication at this point.   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   SECONDARY DIAGNOSES: AXIS I: Rule out delirium from medical condition.  AXIS II: No diagnosis.  AXIS III: Congestive heart failure, chronic obstructive pulmonary disease.  AXIS IV: Moderate to severe from living on his own.  AXIS V: Functioning at time of evaluation 57.    ____________________________ Jake AmelJohn T. Ruhaan Nordahl, MD jtc:ds D: 05/25/2014 17:17:26 ET T: 05/25/2014 17:49:45 ET JOB#: [id number]  cc: Jake AmelJohn T. [name], MD, <Dictator> Jake AmelJOHN T [name] MD ELECTRONICALLY SIGNED 06/07/2014 0:40

## 2015-02-17 NOTE — Consult Note (Signed)
Patient with possible underlying bladder and/or renal malignancy. Given patient's underlying dementia, poor performance status and multiple comorbidities, treatment would be difficult. Will further comment on whether to pursue biopsy and treatment after family meeting with palliative care. Appreciate palliative care input. follow from a distance.  Electronic Signatures: Gerarda FractionFinnegan, Magdalen Cabana (MD)  (Signed on 31-Aug-15 14:13)  Authored  Last Updated: 31-Aug-15 14:13 by Gerarda FractionFinnegan, Armstead Heiland (MD)

## 2015-02-17 NOTE — Consult Note (Signed)
Chief Complaint:  Subjective/Chief Complaint Pt continues to "spit out bright red blood".  Denies nausea or vomiting.   VITAL SIGNS/ANCILLARY NOTES: **Vital Signs.:   26-Aug-15 00:29  Respirations Respirations 22    04:49  Vital Signs Type Routine  Temperature Temperature (F) 97.5  Celsius 36.3  Temperature Source oral  Pulse Pulse 80  Respirations Respirations 22  Systolic BP Systolic BP 116  Diastolic BP (mmHg) Diastolic BP (mmHg) 79  Mean BP 91  Pulse Ox % Pulse Ox % 94  Pulse Ox Activity Level  At rest  Oxygen Delivery 2L   Brief Assessment:  GEN well developed, well nourished, no acute distress, A/Ox3   Cardiac Regular   Respiratory normal resp effort   Gastrointestinal Normal   Gastrointestinal details normal Soft  Nontender  Nondistended  Bowel sounds normal   EXTR 2+ pretibial edema bilaterally   Additional Physical Exam Akin: warm, dry intact HEENT:  bright red blood clot on mid-tongue, no localization of oral bleeding source   Lab Results: TDMs:  26-Aug-15 04:58   Digoxin, Serum 0.99 (Therapeutic range for digoxin in patients with atrial fibrillation: 0.8 - 2.0 ng/mL. In patients with congestive heart failure a therapeutic range of 0.5 - 0.8 ng/mL is suggested as higher levels are associated with an increased risk of toxicity without clear evidence of enhanced efficacy. Digoxin toxicity is commonly associated with serum levels > 2.0 ng/mL but may occur with lower levels, including those in the therapeutic range. Blood samples should be obtained 6-8 hours after administration to assure a reasonable volume of distribution.)  Routine Chem:  26-Aug-15 04:58   Glucose, Serum  103  BUN  47  Creatinine (comp)  2.55  Sodium, Serum  133  Potassium, Serum 5.0  Chloride, Serum 100  CO2, Serum 23  Calcium (Total), Serum 8.5  Anion Gap 10  Osmolality (calc) 279  eGFR (African American)  29  eGFR (Non-African American)  25 (eGFR values <60mL/min/1.73 m2  may be an indication of chronic kidney disease (CKD). Calculated eGFR is useful in patients with stable renal function. The eGFR calculation will not be reliable in acutely ill patients when serum creatinine is changing rapidly. It is not useful in  patients on dialysis. The eGFR calculation may not be applicable to patients at the low and high extremes of body sizes, pregnant women, and vegetarians.)  Routine Hem:  26-Aug-15 04:58   Hemoglobin (CBC)  12.8 (Result(s) reported on 21 Jun 2014 at 06:12AM.)   Assessment/Plan:  Assessment/Plan:  Assessment Duodenal ulcer: Clean base, not source of active bleeding.  On PPI.  Pool of blood noted at vocal cords on EGD. Anemia: Hgb stable   Plan 1) PPI indefinitely 2) Reevaluate for ENT/respiratory source of active bleeding Will sign off, Please call if you have any questions or concerns   Electronic Signatures: Jones, Kandice L (NP)  (Signed 26-Aug-15 09:23)  Authored: Chief Complaint, VITAL SIGNS/ANCILLARY NOTES, Brief Assessment, Lab Results, Assessment/Plan   Last Updated: 26-Aug-15 09:23 by Jones, Kandice L (NP) 

## 2015-02-17 NOTE — Consult Note (Signed)
   Present Illness 69 yo male with history of dilated cardiomyopathy with ef of 20-25% who is followed at the [facility]. He wa admitted with progressive coughing up blood. CXR reveals stable cardiomegaly with no signficant pulmonary edema. Cardiac markers are normal and ekg reveals sinus tachycardia with no ischemia. Pt is hemodynamically stable at present. He has some pleuritic chest pain. Lower extremety dopper reveals no dvt. He has peripheral edema with moderate renal insuffiency with serum creatinine of 1.58. He states he was coughing up blood.   Physical Exam:  GEN well developed, well nourished   HEENT PERRL, hearing intact to voice   NECK supple   RESP normal resp effort  no use of accessory muscles  rhonchi   CARD Tachycardic  Murmur   Murmur Systolic   Systolic Murmur axilla   ABD positive tenderness  normal BS  no Adominal Mass   LYMPH negative neck, negative axillae   EXTR negative cyanosis/clubbing, positive edema, 4+ lower extremety edema   NEURO cranial nerves intact   PSYCH alert, poor insight   Review of Systems:  Subjective/Chief Complaint sob, cough and lower extremety edema   General: Fatigue  Weakness   Skin: No Complaints   ENT: No Complaints   Eyes: No Complaints   Neck: No Complaints   Respiratory: Frequent cough  Short of breath  bloody sputum   Cardiovascular: Dyspnea  Edema   Gastrointestinal: Heartburn   Genitourinary: No Complaints   Vascular: No Complaints   Musculoskeletal: No Complaints   Neurologic: No Complaints   Hematologic: No Complaints   Endocrine: No Complaints   Psychiatric: Anxiety   Review of Systems: All other systems were reviewed and found to be negative   Medications/Allergies Reviewed Medications/Allergies reviewed   Family & Social History:  Family and Social History:  Family History Non-Contributory   EKG:  Interpretation sinus tachycardia    No Known Allergies:    Impression 69 yo male with  hisotyr of chronic systollic heart failure with ef of 20-25% admitted with coughing up blood. Has ruled out for an mi. No evidence of chf clinically or on cxr. No dvt by lower extremety ultrasound. He appears to be at his baseline cardivascularly with metoprolol succinate and digoxin. On long acting nitrates rather than ace i. Would continue with this remedy. Appears stable for egd. APpears well compensated and low to moderate risk for egd. Would proceed with egd with routine cardiovascular monitoring. Continue careful diuresis.   Plan 1. Continue with metoprolol succinate and digoxin at current dose. 2. Low sodium diet. 3. Proceed with egd with routine cardiac monitoring as planned. 4. Will follow with you.   Electronic Signatures: Dalia HeadingFath, Marua Qin A (MD)  (Signed 24-Aug-15 13:32)  Authored: General Aspect/Present Illness, History and Physical Exam, Review of System, Family & Social History, EKG , Allergies, Impression/Plan   Last Updated: 24-Aug-15 13:32 by Dalia HeadingFath, Timonthy Hovater A (MD)

## 2015-02-17 NOTE — Discharge Summary (Signed)
PATIENT NAME:  Jake Hill, ARTERBERRY MR#:  [id number] DATE OF BIRTH:  [birth year]  DATE OF ADMISSION:  06/20/2014 DATE OF DISCHARGE:  06/28/2014  DISCHARGE DIAGNOSES:  1.  Pneumonia with hemoptysis. 2.  Acute renal failure on chronic kidney disease.  3.  Bladder mass, likely cancer.  4.  Anasarca.  5.  History of chronic systolic congestive heart failure with ejection fraction less than 20%.  6.  Hypertension.  7.  Hyperkalemia.  8.  Hyponatremia.   CONDITION: Guarded, prognosis very poor.   CODE STATUS: DNR.   HOME MEDICATIONS: Please refer to the medication reconciliation list. Aspirin is discontinued due to hemoptysis and hematuria.   The patient needs home oxygen 2 liters by nasal cannula.   DIET: Low-sodium, low-fat, low-cholesterol diet.   ACTIVITY: As tolerated.   FOLLOWUP CARE: Follow up with PCP and Dr. Harvie Junior within 1-2 weeks. Follow up cardiology p.r.n.   REASON FOR ADMISSION: Chest pain and shortness of breath.   HOSPITAL COURSE: The patient is 69 year old African American male with a history of cardiomyopathy with ejection fraction less than 20%, who presented to the ED with the recurrent chest pain for 1 month. In addition, the patient complains of shortness of breath and coughing blood 2-3 days. For detailed history and physical examination Please refer to the admission note dictated by Dr. Katharina Caper.    On the day of admission the patient had a chest x-ray which showed possible pneumonia in the right lower lobe. The patient's BUN was 25, creatinine 1.41, BNP 18954,  albumin 2.8, troponin was less than 0.02, WBC 7.2, hemoglobin 12.1.   PROBLEM LIST:  1.  Chest pain with shortness of breath.  Hemoptysis and right lower lobe pneumonia. The patient has been treated with Levaquin, but the patient continuously has hemoptysis. In addition, the patient has shortness of breath, is on oxygen 2 liters by nasal cannula, however, the patient's hemoglobin has been stable at about 12.7.    2.  PUD with duodenal ulcer. Since the patient was coughing blood Dr. Winona Legato requested an ENT consult.  The ENT physician evaluated the patient and suggests that bleeding source is not from the throat. He suggested a GI consult with endoscopy.  GI physician, Dr. Servando Snare, did an endoscopy which showed duodenal ulcer. He suggested PPI b.i.d.   3.  Hematuria with bladder tumor. The patient developed hematuria.  Urologist, Dr. Evelene Croon  evaluated the patient and suggested the patient possibly has  bladder cancer, but since the patient has multiple medical problems, the prognosis is poor, he is not a candidate for a procedure or surgery.  5.  Acute renal failure on CKD. Patient has been treated. Likely from over diuresis and could be ATN. The patient's Lasix was on hold due to acute renal failure,  the patient's renal function has been improving. Creatinine decreased from 2.55 to 1.71 today.  6.  Hyperkalemia. The patient had 1 episode of hyperkalemia with potassium of 5.3. The patient was treated with Kayexalate. Potassium decreased to normal range with today's potassium 4.6.  7.  Anasarca with a history of chronic systolic CHF, ejection fraction less than 20%. The patient was receiving Lasix after improving renal function. In addition, the patient was placed on Aldactone, Imdur, and Toprol. Since the patient has acute renal failure and hyperkalemia, the patient is not able to take ACE inhibitor.  8.  Hypertension, controlled.    Since the patient has multiple medical problems and has a very poor prognosis, Dr. Harvie Junior evaluated  the patient and discussed with the patient's family members. She had a meeting with the patient's son, daughter and  brother who are his  healthcare power of attorney. The final decision was made today. The patient will be discharged to skilled nursing facility and then may go home with hospice care later. The patient will be discharged to a skilled nursing facility today. I discussed the  patient's condition and discharge plan with the patient family members, Dr. Harvie JuniorPhifer, case manager, Child psychotherapistsocial worker, and nurse.   TIME SPENT: About 55 minutes.    ____________________________ Shaune PollackQing Mammie Meras, MD qc:lt D: 06/28/2014 13:11:35 ET T: 06/28/2014 14:39:15 ET JOB#: [id number]  cc: Shaune PollackQing Candido Flott, MD, <Dictator> Shaune PollackQING Jedrick Hutcherson MD ELECTRONICALLY SIGNED 07/10/2014 14:08

## 2015-02-17 NOTE — Consult Note (Signed)
PATIENT NAME:  Jake Hill, [name]:  [id number] DATE OF BIRTH:  Oct 02, 1946  DATE OF CONSULTATION:  06/22/2014  REFERRING PHYSICIAN:  Alford Highlandichard Wieting, MD CONSULTING PHYSICIAN:  Suszanne ConnersMichael R. Evelene CroonWolff, MD  REASON FOR CONSULTATION: Kidney tumor and bladder tumor.   HISTORY OF PRESENT ILLNESS: [name] is a 69 year old African American male admitted to the hospital on August 23 with chest pain and shortness of breath. During the current hospitalization CT scan was obtained indicating the presence of a 4.8 x 4.3 x 5.2 cm left renal mass consistent with cancer. He also had multiple complex right renal cysts. He also had a 2.1 x 2.2 x 3.5 mm tumor at the base of the bladder. Renal function was marginal with a BUN of 47 and creatinine of 2.55. Urological consultation was requested. The patient does give a long history of weak urinary stream. He has not had prostate exam or urologic evaluation previously. He denied gross hematuria or flank pain.   ALLERGIES: No drug allergies.   CHRONIC HOME MEDICATIONS: Included Combivent, aspirin, budesonide, Formoterol, Lasix, atorvastatin, isosorbide mononitrate, metoprolol, digoxin and Tylenol.   PAST SURGICAL HISTORY: The patient denied prior urological surgery.   SOCIAL HISTORY: The patient denied tobacco or alcohol use at the present time.   FAMILY HISTORY: Negative for urologic disease and prostate cancer.   PAST AND CURRENT MEDICAL CONDITIONS:  1.  Severe cardiomyopathy with an ejection fraction of 20%.  2.  Hypertension.  3.  COPD. 4.  Chronic renal insufficiency.  5.  Hyperlipidemia.   REVIEW OF SYSTEMS: The patient denies flank pain, gross hematuria, history of kidney stones, history of prostate cancer or urinary tract infections.   PHYSICAL EXAMINATION:  GENERAL: A chronically ill-appearing African American male in no acute distress.  HEENT: Sclerae were clear.  ABDOMEN: Soft. No CVA tenderness.  GENITOURINARY: The patient was uncircumcised with  moderate scrotal edema. Testes were smooth and nontender, 20 mL in size.  RECTAL: Forty grams, smooth nontender prostate.  EXTREMITIES: Significant for severe edema up to his thighs.   DIAGNOSTIC DATA: CT scan and ultrasound results were reviewed.   IMPRESSION:  1.  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  2.  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 3.  Benign prostatic hypertrophy with lower urinary tract symptoms.   SUGGESTIONS: It is highly doubtful that this patient would ever become a surgical candidate for nephrectomy, partial nephrectomy or resection of bladder tumor do to major co-morbidities.  Suggest referral to palliative care or if he does wish aggressive surgical care, refer to a tertiary center.   ____________________________ Suszanne ConnersMichael R. Evelene CroonWolff, MD mrw:TT D: 06/22/2014 16:19:31 ET T: 06/22/2014 21:25:02 ET JOB#: [id number]  cc: Suszanne ConnersMichael R. Evelene CroonWolff, MD, <Dictator> Orson ApeMICHAEL R WOLFF MD ELECTRONICALLY SIGNED 06/23/2014 8:32

## 2015-02-17 NOTE — Consult Note (Signed)
Brief Consult Note: Diagnosis: L renal tumor. Bladder tumor. BPH. Complex R renal cysts.   Patient was seen by consultant.   Consult note dictated.   Recommend to proceed with surgery or procedure.   Recommend further assessment or treatment.   Discussed with Attending MD.   Comments: Patient is extremely high-risk for partial/radical nephrectomy and resection of bladder tumor. Consider referral to palliative care or if he does want surgery, then refer to tertiary care center.  Electronic Signatures: Orson ApeWolff, Shareef Eddinger R (MD)  (Signed 27-Aug-15 16:22)  Authored: Brief Consult Note   Last Updated: 27-Aug-15 16:22 by Orson ApeWolff, Renatta Shrieves R (MD)

## 2015-02-17 NOTE — Consult Note (Signed)
PATIENT NAME:  Jake Hill, Jake Hill MR#:  [id number] DATE OF BIRTH:  [birth year]  DATE OF CONSULTATION:  01/01/2014  REFERRING PHYSICIAN:   CONSULTING PHYSICIAN:  Jake MillardAlexander Shaia Porath, MD  PRIMARY C[facility] PHYSICIAN[facility]  [facility].    CHIEF COMPLAINT: Shortness of breath.   HISTORY OF PRESENT ILLNESS: The patient is a 69 year old gentleman, currently [facility] the [facility]. The patient has had a history of tobacco abuse, COPD. He also has a history of hypertension. He reports that since the weather has been cold, he has been experiencing increasing shortness of breath. He [facility] to [facility] Emergency Room in respiratory failure, initially required BiPAP. He was admitted to telemetry where he has been undergoing diuresis. The patient has ruled out for myocardial infarction by CPK, isoenzymes and troponin. BUN and creatinine were mildly elevated at 25 and 1.57 respectively. The patient denies chest pain.   PAST MEDICAL HISTORY: 1.  Tobacco abuse/ COPD.  2.  Hypertension.  3.  Probable diastolic dysfunction.   MEDICATIONS ON ADMISSION: Metoprolol 50 mg daily, isosorbide 1 daily, hydralazine 100 mg q.8, furosemide 40 mg daily, Combivent 2 puffs t.i.d., amlodipine 5 mg daily.   SOCIAL HISTORY: The patient currently lives in an apartment. He quit tobacco abuse 2 years ago.   FAMILY HISTORY: No immediate family history of myocardial infarction.   REVIEW OF SYSTEMS:    CONSTITUTIONAL: No fever or chills.  EYES: No blurry vision.  EARS: No hearing loss.  RESPIRATORY: The patient does have chronic exertional dyspnea which is exacerbated by cold weather.  CARDIOVASCULAR: The patient denies chest pain, orthopnea, PND but does have pedal edema.  GASTROINTESTINAL: No nausea, vomiting or diarrhea.  GENITOURINARY: No dysuria or hematuria.  ENDOCRINE: No polyuria or polydipsia.  MUSCULOSKELETAL: No arthralgias or myalgias.  NEUROLOGICAL: No focal muscle weakness or numbness.  PSYCHOLOGICAL: No depression or  anxiety.   PHYSICAL EXAMINATION: VITAL SIGNS: Blood pressure 119/85, pulse 74, respirations 18, temperature 97.8, pulse ox 97%.  HEENT: Pupils equal and reactive to light and accommodation.  NECK: Supple without thyromegaly.  LUNGS: Clear.  HEART: Normal JVP. Normal PMI. Regular rate and rhythm. Normal S1, S2. No appreciable gallop, murmur or rub.  ABDOMEN: Soft and nontender. Pulses were intact bilaterally.  EXTREMITIES: There was 1+ bilateral pedal edema.  MUSCULOSKELETAL: Normal muscle tone.  NEUROLOGIC: The patient is alert and oriented x 3. Motor and sensory both grossly intact.   IMPRESSION: A 69 year old gentleman who presents with respiratory failure, likely multifactorial, primarily due to underlying chronic obstructive pulmonary disease and with an element of probable diastolic congestive heart failure. The patient appears to be responding to diuresis. He has ruled out for myocardial infarction by CPK, isoenzymes and troponin. The patient denies chest pain.   RECOMMENDATIONS: 1.  Continue present meds.  2.  Defer full-dose anticoagulation.  3.  Review 2-D echocardiogram.  4.  Continue diuresis.  5.  Further recommendations pending echocardiogram results.  6.  Defer further cardiac diagnostics at this time.   ____________________________ Jake MillardAlexander Zamyiah [name], MD ap:cs D: 01/01/2014 11:19:34 ET T: 01/01/2014 15:32:14 ET JOB#: [id number]  cc: Jake MillardAlexander Jake Howry, MD, <Dictator> Jake MillardALEXANDER Jake Tutton MD ELECTRONICALLY SIGNED 01/17/2014 12:43

## 2015-02-17 NOTE — Discharge Summary (Signed)
PATIENT NAME:  Jake Hill, Pleasant MR#:  [id number] DATE OF BIRTH:  [birth year]  DATE OF ADMISSION:  12/31/2013 DATE OF DISCHARGE:    PRIMARY CARE PHYSICIAN[facility]  At the [facility].  FINAL DIAGNOSES:  1.  Acute respiratory failure, resolved.  2.  Acute on chronic systolic congestive heart failure.  3.  Hypertension.  4.  Edema.  5.  Chronic kidney disease stage 3.   MEDICATIONS ON DISCHARGE: Include Combivent 2 puffs 3 times a day, Imdur 60 milligrams daily, metoprolol 50 milligrams twice a day, hydralazine 50 milligrams every 8 hours, furosemide 20 milligrams 3 tablets daily, lisinopril 10 milligrams daily, aspirin 81 milligrams daily, Zithromax 250 milligrams once a day for 3 days, budesonide/formoterol inhaler 160/4.5 two puffs twice a day, stop amlodipine.   DIET: Low sodium diet, regular consistency.   ACTIVITY: As tolerated.   FOLLOWUP:  With your cardiologist 1 week, 1 to 2 weeks with your medical doctor.  As per Child psychotherapistsocial worker, patient does not qualify for home health.   HOSPITAL COURSE: The patient was admitted December 31, 2013, and discharged January 02, 2014.  He came in with acute respiratory failure requiring BiPAP, acute congestive heart, possible COPD exacerbation. The patient was started on antibiotics and IV Lasix.   LABORATORY DATA DURING THE HOSPITAL COURSE: Included an EKG that showed sinus rhythm, first degree AV block, left atrial enlargement and left axis deviation. Chest x-ray showed cardiomegaly with vascular congestion.   BNP 9756, glucose 80, BUN 25, creatinine 1.57, sodium 137, potassium 4.5, chloride 108, CO2 of 27, calcium 8.2. Liver function tests: Total bilirubin 1.3, alkaline phosphatase 64, ALT 44, AST 50, total protein 6.9. Troponin negative. White blood cell count 7.3, H and H 12.6 and 37.9, platelet count of 240,000. Echocardiogram showed an EF of less than 20%, mildly dilated left and right atrium, moderate mitral valve regurgitation. Cardiac enzymes remain negative. Pulse  oximetry on room air 93%. Repeat chest x-ray showed no significant change. Cannot exclude early interstitial edema. Creatinine upon discharge was 1.88.   HOSPITAL COURSE PER PROBLEM LIST:  1.  For the patient's acute respiratory failure initially on BiPAP now on room air, this has completely resolved with diuresis.  2.  Acute on chronic systolic congestive heart failure. The patient was diuresed with IV Lasix 40 milligrams intravenous twice daily, switched over to oral Lasix 40 milligrams twice daily and then upon discharge will be 60 milligrams once a day upon going home. Low-dose lisinopril was added with the ejection fraction being very poor. Hydralazine dose was decreased and Imdur was increased.  3.  Hypertension. Blood pressure on the lower side here, which we do want.  Patient was sent home with a blood pressure of 111/95.  4.  Chronic lower extremity edema secondary to poor perfusion and congestive heart failure. Continue to monitor with diuresis.  5.  Chronic kidney disease, stage 3. Continue to monitor as outpatient.  6.  Mild chronic obstructive pulmonary disease exacerbation.  Patient was not put on steroids, just inhalers and antibiotics. Will be tapered to off a couple more days of Zithromax. Lungs were clear upon discharge.   DISPOSITION:  The patient was discharged home in stable condition. High likelihood for cardiopulmonary arrest.   FOLLOWUP:  Can follow up with his cardiologist to see if he is a candidate for a defibrillator.   TIME SPENT ON DISCHARGE: Thirty-five minutes.   CODE STATUS: The patient is a FULL CODE.    ____________________________ Herschell Dimesichard J. Renae GlossWieting, MD rjw:mk D: 01/02/2014  14:58:00 ET T: 01/02/2014 15:39:52 ET JOB#: [id number]  cc: Herschell Dimes. Renae Gloss, MD, <Dictator> Salley Scarlet MD ELECTRONICALLY SIGNED 01/14/2014 15:08

## 2015-02-17 NOTE — Consult Note (Signed)
PATIENT NAME:  Jake Hill, Jake Hill MR#:  [id number] DATE OF BIRTH:  11/26/1945  DATE OF CONSULTATION:  05/29/2014  REFERRING PHYSICIAN:   CONSULTING PHYSICIAN:  Marcina Millard, MD  CARDIOLOGY CONSULTATION   REFERRING PHYSICIAN: Dr. Angelica Ran  PRIMARY CARE PHYSICIAN:  [facility]  CHIEF COMPLAINT: Shortness of breath.   HISTORY OF PRESENT ILLNESS: The patient is a 69 year old gentleman with a known history of dilated cardiomyopathy and recurrent congestive heart failure, who is admitted with a chief complaint of shortness of breath. The patient reports that he was recently here on 05/24/2014 for shortness of breath and then returned on 05/29/2014 for recurrent shortness of breath, consistent with congestive heart failure. The patient apparently had an episode of chest discomfort and was admitted to telemetry. The patient has had borderline elevated troponin without peak or trough. The patient currently is chest pain free. He has had multiple prior admissions for congestive heart failure. He is followed at the [facility]. Overall, the patient reports improved breathing following hospitalization.   PAST MEDICAL HISTORY:  1.  Dilated cardiomyopathy with LV ejection fraction less than 20%. 2.  Recurrent systolic congestive heart failure.  3.  Hypertension.  4.  Hyperlipidemia.  5.  Chronic obstructive pulmonary disease .  MEDICATIONS: Aspirin 81 mg daily, lisinopril 10 mg daily, Imdur 60 mg daily, atorvastatin 20 mg at bedtime, metoprolol 50 mg daily, furosemide 40 mg daily, hydralazine 50 mg q. 8 hours, budesonide formoterol 160/4.5 mcg inhalation 2 puffs b.i.d., Combivent 2 puffs t.i.d. p.r.n.   SOCIAL HISTORY: The patient currently lives alone. He denies tobacco or EtOH abuse.   FAMILY HISTORY: No immediate family history for coronary artery disease or myocardial infarction.    REVIEW OF SYSTEMS: CONSTITUTIONAL: The patient has some mild chills.  EYES: No blurry vision.   EARS: No hearing loss.  RESPIRATORY: The patient has shortness of breath as described above.  CARDIOVASCULAR: The patient had an episode of chest pain, without recurrence.  GASTROINTESTINAL: The patient has some mild nausea.  GENITOURINARY: No dysuria or hematuria.  ENDOCRINE: No polyuria or polydipsia.  MUSCULOSKELETAL: No arthralgias or myalgias.  NEUROLOGICAL: No focal muscle weakness or numbness.  PSYCHOLOGICAL: No depression or anxiety.   PHYSICAL EXAMINATION:  VITAL SIGNS: Blood pressure 108/76, pulse 75, respirations 18, temperature 97.7, pulse oximetry 96%.  HEENT: Pupils equal and reactive to light and accommodation.  NECK: Supple without thyromegaly.  LUNGS: Clear.  HEART: Normal JVP. Diffuse PMI. Regular rate and rhythm. Normal S1, S2. No appreciable gallop, murmur, or rub.  ABDOMEN: Soft and nontender.  EXTREMITIES: Pulses were intact bilaterally. There was 2+ bilateral pedal edema.  MUSCULOSKELETAL: Normal muscle tone.  NEUROLOGIC: The patient is alert and oriented x 3. Motor and sensory both grossly intact.   IMPRESSION: A 69 year old gentleman with a history of dilated cardiomyopathy, recurrent systolic congestive heart failure, who returns with an episode of acute on chronic systolic congestive heart failure with borderline elevated troponin, currently chest pain free. I suspect borderline elevated troponin is secondary to demand/supply ischemia, in light of known history of cardiomyopathy as well as baseline chronic kidney disease. The patient currently is chest pain free.   RECOMMENDATIONS:  1.  Agree with overall current therapy.  2.  Would continue diuresis.  3.  Discontinue heparin drip.  4.  Resume heart failure medications including: Metoprolol, hydralazine, and isosorbide mononitrate  5.  Would defer cardiac catheterization at this time.    ____________________________ Marcina Millard, MD ap:ms D: 05/30/2014 07:59:07 ET  T: 05/30/2014 09:37:43  ET JOB#: [id number]  cc: Marcina MillardAlexander Ciarrah Rae, MD, <Dictator> Marcina MillardALEXANDER Cadance Raus MD ELECTRONICALLY SIGNED 06/06/2014 13:56
